# Patient Record
Sex: Female | Born: 1935 | Race: Black or African American | Hispanic: No | Smoking: Never smoker
Health system: Southern US, Community
[De-identification: ages and names within clinical notes are randomized; demographics above are authoritative.]

## PROBLEM LIST (undated history)

## (undated) DIAGNOSIS — C801 Malignant (primary) neoplasm, unspecified: Secondary | ICD-10-CM

## (undated) DIAGNOSIS — E119 Type 2 diabetes mellitus without complications: Secondary | ICD-10-CM

## (undated) DIAGNOSIS — I10 Essential (primary) hypertension: Secondary | ICD-10-CM

## (undated) DIAGNOSIS — C50919 Malignant neoplasm of unspecified site of unspecified female breast: Secondary | ICD-10-CM

## (undated) DIAGNOSIS — I4891 Unspecified atrial fibrillation: Secondary | ICD-10-CM

---

## 2010-02-14 ENCOUNTER — Encounter: Payer: Self-pay | Admitting: Pediatrics

## 2010-03-03 ENCOUNTER — Encounter: Payer: Self-pay | Admitting: Pediatrics

## 2011-03-22 ENCOUNTER — Encounter: Payer: Self-pay | Admitting: Orthopedic Surgery

## 2011-04-04 ENCOUNTER — Encounter: Payer: Self-pay | Admitting: Orthopedic Surgery

## 2011-06-06 ENCOUNTER — Emergency Department: Payer: Self-pay | Admitting: Emergency Medicine

## 2011-06-06 LAB — URINALYSIS, COMPLETE
Glucose,UR: NEGATIVE mg/dL (ref 0–75)
Hyaline Cast: 1
Leukocyte Esterase: NEGATIVE
Nitrite: NEGATIVE

## 2011-06-06 LAB — COMPREHENSIVE METABOLIC PANEL
Alkaline Phosphatase: 91 U/L (ref 50–136)
BUN: 17 mg/dL (ref 7–18)
Bilirubin,Total: 0.7 mg/dL (ref 0.2–1.0)
Calcium, Total: 9.6 mg/dL (ref 8.5–10.1)
Creatinine: 0.86 mg/dL (ref 0.60–1.30)
SGOT(AST): 29 U/L (ref 15–37)
SGPT (ALT): 17 U/L
Total Protein: 9.9 g/dL — ABNORMAL HIGH (ref 6.4–8.2)

## 2011-06-06 LAB — PROTIME-INR
INR: 2
Prothrombin Time: 22.9 secs — ABNORMAL HIGH (ref 11.5–14.7)

## 2011-06-06 LAB — TROPONIN I: Troponin-I: <0.02 ng/mL

## 2011-06-06 LAB — CBC
HGB: 12.6 g/dL (ref 12.0–16.0)
MCV: 88 fL (ref 80–100)
Platelet: 382 10*3/uL (ref 150–440)
RBC: 4.32 10*6/uL (ref 3.80–5.20)
WBC: 12.8 10*3/uL — ABNORMAL HIGH (ref 3.6–11.0)

## 2011-06-06 LAB — CK TOTAL AND CKMB (NOT AT ARMC)
CK, Total: 47 U/L (ref 21–215)
CK-MB: <0.5 ng/mL — ABNORMAL LOW (ref 0.5–3.6)

## 2011-06-08 LAB — URINE CULTURE

## 2011-07-04 ENCOUNTER — Inpatient Hospital Stay: Payer: Self-pay | Admitting: Internal Medicine

## 2011-07-04 LAB — TROPONIN I
Troponin-I: <0.02 ng/mL
Troponin-I: <0.02 ng/mL

## 2011-07-04 LAB — COMPREHENSIVE METABOLIC PANEL
Albumin: 3.5 g/dL (ref 3.4–5.0)
Anion Gap: 9 (ref 7–16)
BUN: 17 mg/dL (ref 7–18)
Calcium, Total: 8.8 mg/dL (ref 8.5–10.1)
EGFR (African American): >60
EGFR (Non-African Amer.): >60
SGOT(AST): 23 U/L (ref 15–37)
Sodium: 139 mmol/L (ref 136–145)
Total Protein: 9.1 g/dL — ABNORMAL HIGH (ref 6.4–8.2)

## 2011-07-04 LAB — PROTIME-INR
INR: 2.5
Prothrombin Time: 27.1 secs — ABNORMAL HIGH (ref 11.5–14.7)

## 2011-07-04 LAB — CK TOTAL AND CKMB (NOT AT ARMC)
CK, Total: 42 U/L (ref 21–215)
CK, Total: 41 U/L (ref 21–215)
CK-MB: <0.5 ng/mL — ABNORMAL LOW (ref 0.5–3.6)

## 2011-07-04 LAB — CBC
HCT: 33.4 % — ABNORMAL LOW (ref 35.0–47.0)
HGB: 11.1 g/dL — ABNORMAL LOW (ref 12.0–16.0)
MCH: 28.9 pg (ref 26.0–34.0)
MCHC: 33.1 g/dL (ref 32.0–36.0)
Platelet: 328 10*3/uL (ref 150–440)
WBC: 18.8 10*3/uL — ABNORMAL HIGH (ref 3.6–11.0)

## 2011-07-04 LAB — PRO B NATRIURETIC PEPTIDE: B-Type Natriuretic Peptide: 1172 pg/mL — ABNORMAL HIGH (ref 0–450)

## 2011-07-05 LAB — BASIC METABOLIC PANEL
Anion Gap: 6 — ABNORMAL LOW (ref 7–16)
BUN: 15 mg/dL (ref 7–18)
Calcium, Total: 8.9 mg/dL (ref 8.5–10.1)
Chloride: 100 mmol/L (ref 98–107)
EGFR (African American): >60
Glucose: 98 mg/dL (ref 65–99)
Osmolality: 275 (ref 275–301)
Sodium: 137 mmol/L (ref 136–145)

## 2011-07-05 LAB — CBC WITH DIFFERENTIAL/PLATELET
Basophil %: 0.2 %
Eosinophil #: 0.2 10*3/uL (ref 0.0–0.7)
HCT: 32.2 % — ABNORMAL LOW (ref 35.0–47.0)
HGB: 10.6 g/dL — ABNORMAL LOW (ref 12.0–16.0)
Lymphocyte #: 2.3 10*3/uL (ref 1.0–3.6)
Lymphocyte %: 23.3 %
MCH: 28.9 pg (ref 26.0–34.0)
MCHC: 33 g/dL (ref 32.0–36.0)
Monocyte #: 0.9 10*3/uL — ABNORMAL HIGH (ref 0.0–0.7)
Monocyte %: 8.6 %
Neutrophil #: 6.6 10*3/uL — ABNORMAL HIGH (ref 1.4–6.5)
Neutrophil %: 65.9 %
RBC: 3.68 10*6/uL — ABNORMAL LOW (ref 3.80–5.20)
RDW: 17.7 % — ABNORMAL HIGH (ref 11.5–14.5)
WBC: 10 10*3/uL (ref 3.6–11.0)

## 2011-07-05 LAB — TROPONIN I: Troponin-I: <0.02 ng/mL

## 2011-07-05 LAB — CK TOTAL AND CKMB (NOT AT ARMC): CK, Total: 44 U/L (ref 21–215)

## 2011-07-06 DIAGNOSIS — I059 Rheumatic mitral valve disease, unspecified: Secondary | ICD-10-CM

## 2011-07-06 LAB — PROTIME-INR: INR: 2.8

## 2011-07-06 LAB — BASIC METABOLIC PANEL
BUN: 17 mg/dL (ref 7–18)
Calcium, Total: 8.9 mg/dL (ref 8.5–10.1)
Co2: 34 mmol/L — ABNORMAL HIGH (ref 21–32)
EGFR (African American): >60
EGFR (Non-African Amer.): >60
Glucose: 95 mg/dL (ref 65–99)
Potassium: 3.4 mmol/L — ABNORMAL LOW (ref 3.5–5.1)
Sodium: 137 mmol/L (ref 136–145)

## 2011-07-07 LAB — PROTIME-INR: INR: 2.6

## 2011-07-10 LAB — CULTURE, BLOOD (SINGLE)

## 2012-06-19 ENCOUNTER — Encounter: Payer: Self-pay | Admitting: Orthopedic Surgery

## 2012-07-02 ENCOUNTER — Encounter: Payer: Self-pay | Admitting: Orthopedic Surgery

## 2012-08-01 ENCOUNTER — Encounter: Payer: Self-pay | Admitting: Orthopedic Surgery

## 2013-06-16 ENCOUNTER — Encounter: Payer: Self-pay | Admitting: Orthopedic Surgery

## 2013-07-02 ENCOUNTER — Encounter: Payer: Self-pay | Admitting: Orthopedic Surgery

## 2014-07-26 NOTE — H&P (Signed)
PATIENT NAME:  Cindy Tran, Cindy Tran MR#:  [id number] DATE OF BIRTH:  1931-02-16  DATE OF ADMISSION:  07/04/2011  ADMITTING PHYSICIAN: Gladstone Lighter, MD  PRIMARY CARE PHYSICIAN: Dr. Mervyn Skeeters [facility]   CHIEF COMPLAINT: Difficulty breathing.   HISTORY OF PRESENT ILLNESS: Cindy Tran is a 81 year old African American female with past medical history significant for hypertension, diabetes, history of breast cancer status post left mastectomy and radiation chemotherapy with recurrence of the cancer five years ago on the right side status post nipple resection with some radiation at the time, and history of atrial fibrillation and on Coumadin who comes to the hospital from home secondary to sudden onset of shortness of breath and wheezing that started early this morning. The patient lives at home with her son and says she has been having some gradual worsening dyspnea on exertion lately and has episodes of sudden onset of worsening dyspnea. It was so bad last night that she had to wake up around 3:00 a.m. and could not breathe. She denied any chest pain, nausea, cough, fevers, or exposure to anybody sick; no recent travel. She was brought into the emergency room and found to have a blood pressure of 212/99. She is currently on a nitro drip and received one dose of Lasix for pulmonary edema.   PAST MEDICAL HISTORY:  1. Hypertension.  2. Diabetes mellitus.  3. Atrial fibrillation, on Coumadin.  4. History of breast cancer in the 1980s status post left mastectomy with chemoradiation, recurrence of breast cancer five years ago to the right side status post nipple resection and also radiation therapy to the right side.  5. Hypothyroidism.  6. Osteoporosis.   PAST SURGICAL HISTORY:  1. Cataract surgery.  2. Left total knee replacement surgery.  3. Mastectomy.   ALLERGIES TO MEDICATIONS: Vicodin, Percocet, and oxycodone.  HOME MEDICATIONS:  1. Warfarin 5 mg p.o. daily, other than Thursday. On  Thursday she takes 2.5 mg. 2. Tylenol twice a day as needed.  3. Metoprolol ER 200 mg p.o. daily.  4. Levothyroxine 75 mcg p.o. daily.  5. Enalapril 20 mg p.o. twice a day. 6. Simvastatin 20 mg p.o. at bedtime.  7. Albuterol inhaler four times daily as needed.  8. Nexium 40 mg p.o. daily.  9. Diltiazem 30 mg p.o. every 6 hours.   SOCIAL HISTORY: She lives at home with her husband. No history of any smoking or alcohol use. She was exposed to passive smoking.   FAMILY HISTORY: Mom with congestive heart failure and dad died from lung cancer.   REVIEW OF SYSTEMS: CONSTITUTIONAL: No fever, fatigue, or weakness.  EYES: No blurred vision, double vision, or glaucoma. Positive for history of cataracts. ENT: Positive for sinus headaches across the forehead and also in the facial area. No loss of hearing. No tinnitus. No ear discharge. No difficulty swallowing. RESPIRATORY: Positive for dyspnea and wheezing. No history of chronic obstructive pulmonary disease. No cough. CARDIOVASCULAR: No chest pain, orthopnea, or edema. Positive for history of atrial fibrillation. GI: No nausea, vomiting, diarrhea, abdominal pain, hematemesis, or melena. GENITOURINARY: No dysuria. Positive for incontinence and also increased frequency at nighttime. ENDOCRINE: No polyuria, nocturia, heat or cold intolerance. Positive for hypothyroidism. HEMATOLOGY: No anemia, easy bruising, or bleeding. SKIN: No acne, rash, or lesions. MUSCULOSKELETAL: Positive for arthritis, especially in the knees. No gout. NEUROLOGIC: No CVA, transient ischemic attack, or seizures. PSYCHOLOGIC: No anxiety, insomnia, or depression.   PHYSICAL EXAMINATION:   VITAL SIGNS: Temperature 96.4 degrees Fahrenheit, pulse 74, respirations  35, blood pressure 212/99, and pulse oximetry 94% on room air currently.  GENERAL: Heavily built, well-nourished female sitting in bed not in any acute distress.   HEENT: Normocephalic, atraumatic. Pupils equal, round, and  reacting to light. Anicteric sclerae. Extraocular movements intact. Oropharynx clear without erythema, mass, or exudates.   NECK: Supple. No thyromegaly, JVD, or carotid bruits. No lymphadenopathy.   LUNGS: Moving air bilaterally. Has diffuse scattered wheeze and bibasilar crackles. No use of accessory muscles for breathing.   HEART: S1 and S2 regular rate and rhythm. No murmurs, rubs, or gallops.   ABDOMEN: Soft, nontender, and nondistended. No hepatosplenomegaly. Normal bowel sounds.   EXTREMITIES: She does have 2+ pedal edema on the right foot and 1+ on the left side. Feeble dorsalis pedis pulses bilaterally. No clubbing or cyanosis.   SKIN: No acne, rash, or lesions.   NEUROLOGIC: Cranial nerves intact. No focal motor or sensory deficits.   PSYCH: The patient is awake, alert, and oriented x3.   LABS/STUDIES:  WBC 18.8, hemoglobin 11.1, hematocrit 33.4, and platelet count 328.   Sodium 139, potassium 3.8, chloride 102, bicarbonate 28, BUN 17, creatinine 0.83, glucose 156, and calcium 8.8.   ALT 16, AST 23, alkaline phosphatase 95, total bilirubin 0.6, and albumin 3.5. BNP is elevated at 1172. Cardiac enzymes, first set, remains negative. INR is therapeutic at 2.5.   Chest x-ray is showing previous or recurrent right upper lobe malignant disease associated with fibrosis and superimposed right pleural effusion and some mild edema suggested.  CT of the head without contrast is showing chronic changes within the brain. No evidence of acute ischemic or hemorrhagic event. Inflammatory changes with air-fluid levels seen in the ethmoid and right maxillary and sphenoid sinuses which could be causing the patient's acute symptoms.  CT of the chest with contrast is showing interval increase in size of the bilateral pleural effusions, increased density in the left upper lobe and the left costophrenic angle posteriorly reflecting atelectasis or early pneumonia. Overall interstitial markings  suggestive of CHF.  Extensive chronic changes in the right upper lobe and right perihilar region is present. Cannot exclude infectious or neoplastic etiologies superimposed upon chronic lung disease. Multiple gallstones are present. No evidence of PE.   EKG is showing normal sinus rhythm, no acute ST-T wave changes.   ASSESSMENT AND PLAN: This is a 81 year old African American female with past medical history of hypertension, breast cancer status post treatment, and atrial fibrillation on Coumadin brought in for acute dyspnea, hypertensive urgency, and pulmonary edema.  1. Acute respiratory failure secondary to pulmonary edema and also possible pneumonia versus radiation induced fibrosis in the right upper lobe. CT of the chest has been just done and it shows possible left lower lobe pneumonia and also chronic changes in the right upper lobe. Send for blood cultures and start on Rocephin and azithromycin. Continue oxygen support as needed. She was given one dose of Lasix 60 mg IV in the ED and we will start her on 20 mg IV twice a day and also get an echocardiogram.  2. Acute pulmonary edema associated with hypertensive urgency. No prior history of congestive heart failure. We will diurese her with Lasix 20 mg IV twice a day and get an echo for ejection fraction evaluation. 3. Hypertensive urgency. Currently started on nitro drip, so we will admit to the Critical Care Unit. Try to wean off the nitro drip and can be transferred to a telemetry bed. We will restart her home medications of  Toprol, Cardizem, and enalapril at this time, and other medications like Imdur can be added. We will hold off on Norvasc because of worsening pedal edema.  4. Atrial fibrillation, rate controlled on Toprol and Cardizem, also on Coumadin. INR is therapeutic.  5. GI and deep vein thrombosis prophylaxis. The patient is on Nexium and also on Coumadin.   CODE STATUS: FULL CODE.   TIME SPENT ON ADMISSION: 50 minutes.   ____________________________ Gladstone Lighter, MD rk:slb D: 07/04/2011 11:38:00 ET     T: 07/04/2011 12:20:04 ET        JOB#: [id number] cc: Dr. Mervyn Skeeters [facility]  Gladstone Lighter MD ELECTRONICALLY SIGNED 07/07/2011 13:48

## 2014-07-26 NOTE — Discharge Summary (Signed)
PATIENT NAME:  Cindy Tran, Cindy Tran MR#:  [id number] DATE OF BIRTH:  1930/10/04  DATE OF ADMISSION:  07/04/2011 DATE OF DISCHARGE:  07/07/2011  ADMITTING PHYSICIAN: Gladstone Lighter, MD  DISCHARGING PHYSICIAN: Gladstone Lighter, MD  PRIMARY CARE PHYSICIAN: Lacey Jensen, MD - [facility]   CONSULTANTS: Wallene Huh, MD - Pulmonary.   DISCHARGE DIAGNOSES:  1. Acute respiratory failure.  2. Pulmonary edema.  3. Acute on chronic congestive heart failure with both systolic and diastolic dysfunction, ejection fraction is 45%.  4. Left-sided pneumonia.  5. Hypertensive urgency.  6. Diabetes mellitus.  7. Chronic atrial fibrillation, on Coumadin.  8. Hypothyroidism.  9. History of breast cancer status post left mastectomy and chemoradiation.  10. Acute sinusitis with sinus headaches.  DISCHARGE MEDICATIONS:  1. Albuterol 90 mcg inhalation four times daily.  2. Nexium 40 mg p.o. daily.  3. Metoprolol succinate 200 mg p.o. daily.  4. Levothyroxine 75 mcg p.o. daily.  5. Enalapril 20 mg p.o. twice a day. 6. Tylenol Arthritis capsules 1300 mg orally twice a day as needed.  7. Simvastatin 20 mg p.o. daily.  8. Cardizem 30 mg p.o. every 6 hours.  9. Warfarin 5 mg p.o. every day except Thursday, on Thursday to take 2.5 mg.   10. Lasix 40 mg p.o. twice a day. 11. Levaquin 500 mg p.o. daily until 07/10/2011. 12. Nasonex nasal spray 100 mcg two sprays to each nostril twice a day. 13. Potassium chloride 20 milliequivalents p.o. daily.  DISCHARGE DIET: Low-sodium diet.   DISCHARGE ACTIVITY: As tolerated.  FOLLOWUP INSTRUCTIONS: PCP followup in one week and INR check in 2 to 3 days.   LABS AND IMAGING STUDIES: WBC 10.0, hemoglobin 10.6, hematocrit 32.2, platelet count 306.   Sodium 137, potassium 3.4, chloride 96, bicarbonate 34, BUN 17, creatinine 0.91, glucose 95, calcium 8.9.   INR at the time of discharge 2.6.  MRI of the brain showed significant sinus disease. Could  be the etiology of the patient's headaches. Correlate symptoms for acute sinusitis. Changes of atrophy, small vessel chronic ischemic disease present in the brain. No acute intracranial abnormalities seen. Her sinus changes are more significant on the right side than the left side, in the maxillary sinus and sphenoid sinus. There are small air fluid levels in right sphenoid. Also present bilaterally in frontal sinuses with small amount of fluid in the left frontal sinus.   Echocardiogram showed LV systolic function is mildly reduced. Ejection fraction is 45% to 50%. Moderate concentric left ventricular hypertrophy and impaired LV relaxation are present. Right ventricular systolic function is normal. Right and left atrium are mildly dilated and mild mitral regurgitation is present.   Blood cultures remained negative.   CT of the head without contrast showed inflammation changes with air fluid levels in the right maxillary, sphenoid sinus and left frontal sinus. No evidence of any acute changes.   CT of the chest with contrast showed interval decrease in size of bilateral pleural effusions. Increased interstitial density in the left upper lobe and left costophrenic angle, likely early pneumonia. Extensive chronic changes in the right upper lobe, right perihilar region. Multiple gallstones are present. No evidence of any acute PE.  Chest x-ray on admission showed right upper lobe malignant disease with fibrosis versus chronic changes. Superimposed right pleural effusion and possible mild edema present.   BNP was elevated at 1172 on admission. Cardiac enzymes remained negative.   BRIEF HOSPITAL COURSE: Cindy Tran is a 81 year old female with past medical history significant  for diabetes, hypertension, atrial fibrillation on Coumadin, and diastolic congestive heart failure who presented to the hospital with sudden onset of shortness of breath. The patient was also found to be in hypertensive urgency with  blood pressure of 212/99 and chest x-ray with pulmonary edema.  1. Acute hypoxic respiratory failure requiring 4 liters of oxygen in the hospital secondary to pulmonary edema and also left upper lobe pneumonia. Blood cultures have remained negative. She was treated with Rocephin and Zithromax in the hospital and is being discharged on Levaquin. She also does have right upper lobe and perihilar region chronic changes/fibrosis on CT which is secondary to radiation from breast cancer.  2. Acute on chronic diastolic and systolic heart failure with an ejection fraction of 45%: Discussed this with the patient's primary care physician, Dr. Mervyn Skeeters, and the patient has known history of diastolic dysfunction and now with mild systolic dysfunction. Probably hypertensive urgency could have triggered it. She was diuresed with Lasix 40 mg IV twice achieving clinical improvement and also weaned off oxygen. She is ambulating well on room air currently and is being discharged on twice a day Lasix along with potassium supplements also. She is on medications with Toprol, enalapril, and simvastatin.  3. Chronic atrial fibrillation: Rate is better controlled with p.o. Cardizem and Toprol. She is also on warfarin for anticoagulation. INR is therapeutic.  4. Chronic headaches, possible sinus headaches, as seen on MRI of the brain, as mentioned above. Nasonex nasal spray is being started and also she will be on an antibiotic and will be followed by her PCP as an outpatient.  5. Hypertensive urgency: Blood pressure was better controlled. She is on enalapril, Toprol, Cardizem, and also Lasix is being added at the time of discharge.   Her overall course has been otherwise uneventful. Physical therapy worked with the patient and recommended home health, but the patient refused home health and the daughter supported the patient and said she would take care of her mother.   DISCHARGE CONDITION: Stable.     DISCHARGE DISPOSITION:  Home.   TIME SPENT ON DISCHARGE: 45 minutes. ____________________________ Gladstone Lighter, MD rk:slb D: 07/07/2011 13:32:32 ET T: 07/08/2011 13:44:20 ET JOB#: [id number]  cc: Gladstone Lighter, MD, <Dictator> Lacey Jensen, MD - [place] MD ELECTRONICALLY SIGNED 07/12/2011 13:43

## 2016-03-07 ENCOUNTER — Ambulatory Visit: Payer: Medicare Other | Attending: Gastroenterology | Admitting: Physical Therapy

## 2016-03-07 DIAGNOSIS — R2689 Other abnormalities of gait and mobility: Secondary | ICD-10-CM | POA: Diagnosis present

## 2016-03-07 DIAGNOSIS — M545 Low back pain, unspecified: Secondary | ICD-10-CM

## 2016-03-07 DIAGNOSIS — G8929 Other chronic pain: Secondary | ICD-10-CM | POA: Diagnosis present

## 2016-03-07 DIAGNOSIS — M6281 Muscle weakness (generalized): Secondary | ICD-10-CM | POA: Diagnosis present

## 2016-03-07 DIAGNOSIS — R269 Unspecified abnormalities of gait and mobility: Secondary | ICD-10-CM | POA: Diagnosis present

## 2016-03-08 ENCOUNTER — Encounter: Payer: Self-pay | Admitting: Physical Therapy

## 2016-03-08 NOTE — Therapy (Signed)
[facility] [address] [place], [name], [address] Phone: [phone]   Fax:  [phone]  Physical Therapy Evaluation  Patient Details  Name: Cindy Tran MRN: [id number] Date of Birth: 04/23/1930 Referring Provider: Rolene Arbour, FNP  Encounter Date: 03/07/2016      PT End of Session - 03/08/16 1746    Visit Number 1   Number of Visits 8   Date for PT Re-Evaluation 04/04/16   Authorization - Visit Number 1   Authorization - Number of Visits 10   PT Start Time [id number]   PT Stop Time 0952   PT Time Calculation (min) 57 min   Equipment Utilized During Treatment Gait belt   Activity Tolerance Patient tolerated treatment well;Patient limited by fatigue   Behavior During Therapy [facility] for tasks assessed/performed      History reviewed. No pertinent past medical history.  History reviewed. No pertinent surgical history.  There were no vitals filed for this visit.       Subjective Assessment - 03/08/16 1733    Subjective Pt. reports significant R LE muscle weakness resulting in 2 recent falls.  Pt. fell on 02/23/16 while climbing stairs in front of daughter's house.  Pt. reports R knee buckles and requires use of RW.  Pt. has increase LBP with standing 5/10.      Patient is accompained by: Family member   Limitations Standing;Walking;House hold activities   Patient Stated Goals Increase LE muscle strength to improve balance and gait.     Currently in Pain? Yes   Pain Score 5    Pain Location Back   Pain Orientation Mid;Lower            [facility] PT Assessment - 03/08/16 0001      Assessment   Medical Diagnosis R leg weakness/ Atrophy of muscle of R lower leg/ Chronic bilateral low back pain without sciatica   Referring Provider Rolene Arbour, FNP   Onset Date/Surgical Date 02/23/16   Prior Therapy yes     Balance Screen   Has the patient fallen in the past 6 months Yes   How many times? 2   Has the patient had a  decrease in activity level because of a fear of falling?  Yes      See HEP.  Nustep L4 B UE/LE 10 min. (no rest breaks).        PT Education - 03/07/16 1322    Education provided Yes   Education Details See HEP   Person(s) Educated Patient;Child(ren);Caregiver(s)   Methods Explanation;Demonstration;Handout;Verbal cues   Comprehension Verbalized understanding;Returned demonstration;Need further instruction             PT Long Term Goals - 03/08/16 1757      PT LONG TERM GOAL #1   Title Pt. independent with HEP to increase B LE muscle strength to grossly 4/5 MMT to improve standing tolerance/ walking.    Baseline B LE muscle strength grossly 3/5 MMT   Time 4   Period Weeks   Status New     PT LONG TERM GOAL #2   Title Pt. able to ascend/ descend 4 steps use of 1 UE and step to pattern safely.     Baseline Difficulty stepping up with L or R LE on 6" step.     Time 4   Period Weeks   Status New     PT LONG TERM GOAL #3   Title Pt. able to ambulate 10 minutes with  use of RW and consistent step pattern with no LOB to improve mobility.     Baseline limited standing/walking endurance   Time 4   Period Weeks   Status New     PT LONG TERM GOAL #4   Title Pt. will have no episodes of R knee buckling consistently for a week to promote safety/ prevent fall risk.     Baseline Frequent episodes of R knee buckling with standing/walking   Time 4   Period Weeks   Status New               Plan - 03/08/16 1747    Clinical Impression Statement Pt. is a pleasant 82 y/o female with chronic, progressive B LE muscle weakness (R weaker than L) and back pain.  Pt. reports 5/10 LBP with prolonged standing/walking.  Significant grade III pitting edema in B lower legs.  Pt. has difficulty wearing shoes and currently has on bedroom slippers.  B LE AROM WFL except hip flexion limited 50%.  B LE muscle strength grossly 3/5 MMT.  Pt. requires numerous seated rest breaks with all  standing/walking tasks.  Pt. ambulates with forward posture and use of RW for safety.  Pt. will benefit from skilled PT services to increase B LE muscle strength to improve standing tolerance/ walking/ balance to prevent falls.     Rehab Potential Fair   PT Frequency 2x / week   PT Duration 4 weeks   PT Treatment/Interventions ADLs/Self Care Home Management;DME Instruction;Gait training;Stair training;Functional mobility training;Therapeutic activities;Therapeutic exercise;Patient/family education;Neuromuscular re-education;Balance training;Manual techniques;Energy conservation;Passive range of motion   PT Next Visit Plan Reassess HEP/ balance and gait activities.     PT Home Exercise Plan See HEP      Patient will benefit from skilled therapeutic intervention in order to improve the following deficits and impairments:  Abnormal gait, Improper body mechanics, Pain, Postural dysfunction, Decreased coordination, Decreased activity tolerance, Decreased endurance, Decreased range of motion, Decreased strength, Difficulty walking, Decreased safety awareness, Decreased balance  Visit Diagnosis: Gait difficulty  Muscle weakness (generalized)  Chronic bilateral low back pain without sciatica  Abnormality of gait due to impairment of balance      G-Codes - 03-13-2016 1756    Functional Assessment Tool Used Clinical impression/ gait difficulty/ pain/ muscle weakness   Functional Limitation Mobility: Walking and moving around   Mobility: Walking and Moving Around Current Status [id number]) At least 60 percent but less than 80 percent impaired, limited or restricted   Mobility: Walking and Moving Around Goal Status [phone]) At least 20 percent but less than 40 percent impaired, limited or restricted       Problem List There are no active problems to display for this patient.  Pura Spice, PT, DPT # [phone] 03/08/2016, 6:02 PM  [facility] [address] [place], [name], [address] Phone: [phone]   Fax:  [phone]  Name: Cindy Tran MRN: [id number] Date of Birth: 1931/01/23

## 2016-03-09 ENCOUNTER — Ambulatory Visit: Payer: Medicare Other | Admitting: Physical Therapy

## 2016-03-09 DIAGNOSIS — R269 Unspecified abnormalities of gait and mobility: Secondary | ICD-10-CM

## 2016-03-09 DIAGNOSIS — M6281 Muscle weakness (generalized): Secondary | ICD-10-CM

## 2016-03-09 DIAGNOSIS — M545 Low back pain, unspecified: Secondary | ICD-10-CM

## 2016-03-09 DIAGNOSIS — G8929 Other chronic pain: Secondary | ICD-10-CM

## 2016-03-09 DIAGNOSIS — R2689 Other abnormalities of gait and mobility: Secondary | ICD-10-CM

## 2016-03-10 NOTE — Therapy (Addendum)
[facility] [address] [place], [name], [address] Phone: [phone]   Fax:  [phone]  Physical Therapy Treatment  Patient Details  Name: Cindy Tran MRN: [id number] Date of Birth: 07/30/1930 Referring Provider: Rolene Arbour, FNP  Encounter Date: 03/09/2016      PT End of Session - 03/10/16 1619    Visit Number 2   Number of Visits 8   Date for PT Re-Evaluation 04/04/16   Authorization - Visit Number 2   Authorization - Number of Visits 10   PT Start Time 1350   PT Stop Time 1448   PT Time Calculation (min) 58 min   Equipment Utilized During Treatment Gait belt   Activity Tolerance Patient tolerated treatment well;Patient limited by fatigue   Behavior During Therapy [facility] for tasks assessed/performed      No past medical history on file.  No past surgical history on file.  There were no vitals filed for this visit.      Subjective Assessment - 03/10/16 1618    Patient is accompained by: Family member   Limitations Standing;Walking;House hold activities   Patient Stated Goals Increase LE muscle strength to improve balance and gait.     Currently in Pain? No/denies     LEFS: 22 out of 80.   OBJECTIVE:  Therex:  Supine ex.: heel slides/ quad sets/ hip flexion/ marching/ trunk rotn.  Seated LAQ/ heel raises/ attempted hip flexion but requires PT assist.  Reviewed HEP.  Neuro: 3" step ups/ downs 10x.  Transfer training for varying heights on blue mat table.  Step overs 3" plinth.  Gait training: instructed in proper use of rollator with consistent step pattern/ cuing for increase hip/knee flexion.  Pt. Requires mod verbal cuing to correct upright posture/ head position.  Nustep L4 10 min. B UE/LE at end of tx. (no charge).      Pt response for medical necessity:  Pt. Benefits from PT to increase generalized strength/ muscle endurance to improve standing tolerance and mod. Independence with gait/rollator.        Pt. benefits from use of rollator with gait in house and outside.  Pt. good at controlling brakes during transfers/ prolonged standing and while sitting on rollator.  Pt. easily fatigued during all aspects of ther.ex.           PT Long Term Goals - 03/08/16 1757      PT LONG TERM GOAL #1   Title Pt. independent with HEP to increase B LE muscle strength to grossly 4/5 MMT to improve standing tolerance/ walking.    Baseline B LE muscle strength grossly 3/5 MMT   Time 4   Period Weeks   Status New     PT LONG TERM GOAL #2   Title Pt. able to ascend/ descend 4 steps use of 1 UE and step to pattern safely.     Baseline Difficulty stepping up with L or R LE on 6" step.     Time 4   Period Weeks   Status New     PT LONG TERM GOAL #3   Title Pt. able to ambulate 10 minutes with use of RW and consistent step pattern with no LOB to improve mobility.     Baseline limited standing/walking endurance   Time 4   Period Weeks   Status New     PT LONG TERM GOAL #4   Title Pt. will have no episodes of R knee buckling consistently for  a week to promote safety/ prevent fall risk.     Baseline Frequent episodes of R knee buckling with standing/walking   Time 4   Period Weeks   Status New               Plan - 03/10/16 1620    Rehab Potential Fair   PT Frequency 2x / week   PT Duration 4 weeks   PT Treatment/Interventions ADLs/Self Care Home Management;DME Instruction;Gait training;Stair training;Functional mobility training;Therapeutic activities;Therapeutic exercise;Patient/family education;Neuromuscular re-education;Balance training;Manual techniques;Energy conservation;Passive range of motion   PT Next Visit Plan Reassess HEP/ balance and gait activities.     PT Home Exercise Plan See HEP   Consulted and Agree with Plan of Care Patient;Family member/caregiver      Patient will benefit from skilled therapeutic intervention in order to improve the following deficits and  impairments:  Abnormal gait, Improper body mechanics, Pain, Postural dysfunction, Decreased coordination, Decreased activity tolerance, Decreased endurance, Decreased range of motion, Decreased strength, Difficulty walking, Decreased safety awareness, Decreased balance  Visit Diagnosis: Gait difficulty  Muscle weakness (generalized)  Chronic bilateral low back pain without sciatica  Abnormality of gait due to impairment of balance     Problem List There are no active problems to display for this patient.  Pura Spice, PT, DPT # [id number] 03/10/2016, 4:21 PM  [place] [facility] [address] [place], [name], [address] Phone: [phone]   Fax:  [phone]  Name: Rosia Rhome MRN: [id number] Date of Birth: 1930/06/15

## 2016-03-14 ENCOUNTER — Ambulatory Visit: Payer: Medicare Other | Admitting: Physical Therapy

## 2016-03-14 DIAGNOSIS — M6281 Muscle weakness (generalized): Secondary | ICD-10-CM

## 2016-03-14 DIAGNOSIS — R269 Unspecified abnormalities of gait and mobility: Secondary | ICD-10-CM

## 2016-03-14 DIAGNOSIS — R2689 Other abnormalities of gait and mobility: Secondary | ICD-10-CM

## 2016-03-14 DIAGNOSIS — G8929 Other chronic pain: Secondary | ICD-10-CM

## 2016-03-14 DIAGNOSIS — M545 Low back pain: Secondary | ICD-10-CM

## 2016-03-15 NOTE — Therapy (Signed)
[place] [facility] [address] [place], [name], [address] Phone: [phone]   Fax:  [phone]  Physical Therapy Treatment  Patient Details  Name: Cindy Tran MRN: [id number] Date of Birth: 06/23/1930 Referring Provider: Rolene Arbour, FNP  Encounter Date: 03/14/2016      PT End of Session - 03/15/16 1433    Visit Number 3   Number of Visits 8   Date for PT Re-Evaluation 04/04/16   Authorization - Visit Number 3   Authorization - Number of Visits 10   PT Start Time 0947   PT Stop Time 1045   PT Time Calculation (min) 58 min   Equipment Utilized During Treatment Gait belt   Activity Tolerance Patient tolerated treatment well;Patient limited by fatigue   Behavior During Therapy [facility] for tasks assessed/performed      No past medical history on file.  No past surgical history on file.  There were no vitals filed for this visit.      Subjective Assessment - 03/15/16 1432    Subjective Pt. entered PT with new rollator and wants PT to explain how to use/ make sure it is safe.  Pts. dtr. states that pt. hasn't been able to go out to restaurant due to limitations in walking and is hoping with rollator/ strengthening she will be more independent outside of house.     Patient is accompained by: Family member   Limitations Standing;Walking;House hold activities   Patient Stated Goals Increase LE muscle strength to improve balance and gait.     Currently in Pain? No/denies        OBJECTIVE:  There.ex.: Seated hip flexion (with assist)/ LAQ/ heel raises/ step touches (6") 10x2. Each.  Lateral walking in //-bars with min. UE assist 5x L/R.  3" step ups/ overs with UE assist 10x2.  Gait training:  Ambulate in clinic with use of rollator.  Pt. Properly sized in rollator and brakes adjusted.  Pt. Requires moderate cuing to increase hip flexion/ step pattern to improve gait efficiency/ prevent shuffling pattern.  Several short seated rest  breaks required.      Pt response for medical necessity:  Benefits from B LE strengthening/ endurance ex. Program to improve standing tolerance and safety with walking.  No LOB during tx. Session but requires B UE assist for all walking tasks.          PT Long Term Goals - 03/08/16 1757      PT LONG TERM GOAL #1   Title Pt. independent with HEP to increase B LE muscle strength to grossly 4/5 MMT to improve standing tolerance/ walking.    Baseline B LE muscle strength grossly 3/5 MMT   Time 4   Period Weeks   Status New     PT LONG TERM GOAL #2   Title Pt. able to ascend/ descend 4 steps use of 1 UE and step to pattern safely.     Baseline Difficulty stepping up with L or R LE on 6" step.     Time 4   Period Weeks   Status New     PT LONG TERM GOAL #3   Title Pt. able to ambulate 10 minutes with use of RW and consistent step pattern with no LOB to improve mobility.     Baseline limited standing/walking endurance   Time 4   Period Weeks   Status New     PT LONG TERM GOAL #4   Title Pt. will have  no episodes of R knee buckling consistently for a week to promote safety/ prevent fall risk.     Baseline Frequent episodes of R knee buckling with standing/walking   Time 4   Period Weeks   Status New               Plan - 03/15/16 1433    Clinical Impression Statement No c/o pain during tx. session but L hip soreness noted with standing tasks.  Pt. instructed in proper use of rollator and ambulates with improved overall mobility around clinic with rollator as compared to RW.  Pt. is easily fatigued with standing ther.ex. and requires occasional seated rest breaks.  Pt. able to lock brakes of rollator and sit on chair with proper technique safely.  Difficulty with seated hip flexion due to muscle weakness.    Rehab Potential Fair   PT Frequency 2x / week   PT Treatment/Interventions ADLs/Self Care Home Management;DME Instruction;Gait training;Stair training;Functional  mobility training;Therapeutic activities;Therapeutic exercise;Patient/family education;Neuromuscular re-education;Balance training;Manual techniques;Energy conservation;Passive range of motion   PT Next Visit Plan Increase B LE muscle strength/ discuss use of rollator at home   PT Home Exercise Plan See HEP   Consulted and Agree with Plan of Care Patient;Family member/caregiver      Patient will benefit from skilled therapeutic intervention in order to improve the following deficits and impairments:  Abnormal gait, Improper body mechanics, Pain, Postural dysfunction, Decreased coordination, Decreased activity tolerance, Decreased endurance, Decreased range of motion, Decreased strength, Difficulty walking, Decreased safety awareness, Decreased balance  Visit Diagnosis: Gait difficulty  Muscle weakness (generalized)  Chronic bilateral low back pain without sciatica  Abnormality of gait due to impairment of balance     Problem List There are no active problems to display for this patient.  Pura Spice, PT, DPT # [phone] 03/15/2016, 2:40 PM  [place] [facility] [address] [place], [name], [address] Phone: [phone]   Fax:  [phone]  Name: Cindy Tran MRN: [id number] Date of Birth: July 10, 1930

## 2016-03-16 ENCOUNTER — Ambulatory Visit: Payer: Medicare Other | Admitting: Physical Therapy

## 2016-03-16 DIAGNOSIS — R269 Unspecified abnormalities of gait and mobility: Secondary | ICD-10-CM

## 2016-03-16 DIAGNOSIS — M545 Low back pain, unspecified: Secondary | ICD-10-CM

## 2016-03-16 DIAGNOSIS — R2689 Other abnormalities of gait and mobility: Secondary | ICD-10-CM

## 2016-03-16 DIAGNOSIS — G8929 Other chronic pain: Secondary | ICD-10-CM

## 2016-03-16 DIAGNOSIS — M6281 Muscle weakness (generalized): Secondary | ICD-10-CM

## 2016-03-16 NOTE — Therapy (Addendum)
[facility] [address] [place], [name], [address] Phone: [phone]   Fax:  [phone]  Physical Therapy Treatment  Patient Details  Name: Cindy Tran MRN: [id number] Date of Birth: 1930/08/02 Referring Provider: Rolene Arbour, FNP  Encounter Date: 03/16/2016      PT End of Session - 03/16/16 1216    Visit Number 4   Number of Visits 8   Date for PT Re-Evaluation 04/04/16   Authorization - Visit Number 4   Authorization - Number of Visits 10   PT Start Time 0944   PT Stop Time 1037   PT Time Calculation (min) 53 min   Equipment Utilized During Treatment Gait belt   Activity Tolerance Patient tolerated treatment well;Patient limited by fatigue   Behavior During Therapy [facility] for tasks assessed/performed      No past medical history on file.  No past surgical history on file.  There were no vitals filed for this visit.      Subjective Assessment - 03/16/16 1216    Patient is accompained by: Family member   Limitations Standing;Walking;House hold activities   Patient Stated Goals Increase LE muscle strength to improve balance and gait.     Currently in Pain? No/denies      Pt. states she has liked using rollator at home.  Pt. has not used RW since beginning use of rollator.    OBJECTIVE:  There.ex.: Nustep L6 10 min. (no charge/ warm-up).  Sit to stands from green chair with moderate cuing for proper technique.  Seated hip flexion (with assist)/ LAQ/ heel raises/ step touches (6") 10x2. Each.  Lateral walking in //-bars with min. UE assist 5x L/R.  3" step ups/ overs with UE assist 10x2.  Neuro. Mm.:  Ambulate in clinic with use of rollator.  Pt. Requires moderate cuing to increase hip flexion/ step pattern to improve gait efficiency/ prevent shuffling pattern.  Turning in //-bars/ rollator with UE assist working on foot placement/ posture.  Standing dynamic balance tasks with 1 UE assist.  Several short seated rest  breaks required.                 Pt response for medical necessity:  Benefits from B LE strengthening/ endurance ex. Program to improve standing tolerance and safety with walking.  No LOB during tx. Session but requires B UE assist for all walking tasks.      Limited B hip flexor muscle strength/ ROM in sitting and standing posture.  Pt. benefits from UE assist for safety/ balance.  No LOB during tx. session but pt. remains fearful of B knee buckling during turning/ step touches.  Moderate generalized muscle fatigue t/o tx. session.         PT Long Term Goals - 03/08/16 1757      PT LONG TERM GOAL #1   Title Pt. independent with HEP to increase B LE muscle strength to grossly 4/5 MMT to improve standing tolerance/ walking.    Baseline B LE muscle strength grossly 3/5 MMT   Time 4   Period Weeks   Status New     PT LONG TERM GOAL #2   Title Pt. able to ascend/ descend 4 steps use of 1 UE and step to pattern safely.     Baseline Difficulty stepping up with L or R LE on 6" step.     Time 4   Period Weeks   Status New     PT LONG TERM  GOAL #3   Title Pt. able to ambulate 10 minutes with use of RW and consistent step pattern with no LOB to improve mobility.     Baseline limited standing/walking endurance   Time 4   Period Weeks   Status New     PT LONG TERM GOAL #4   Title Pt. will have no episodes of R knee buckling consistently for a week to promote safety/ prevent fall risk.     Baseline Frequent episodes of R knee buckling with standing/walking   Time 4   Period Weeks   Status New               Plan - 03/16/16 1216    Rehab Potential Fair   PT Frequency 2x / week   PT Duration 4 weeks   PT Treatment/Interventions ADLs/Self Care Home Management;DME Instruction;Gait training;Stair training;Functional mobility training;Therapeutic activities;Therapeutic exercise;Patient/family education;Neuromuscular re-education;Balance training;Manual techniques;Energy  conservation;Passive range of motion   PT Next Visit Plan Increase B LE muscle strength/ discuss use of rollator at home   PT Home Exercise Plan See HEP   Consulted and Agree with Plan of Care Patient;Family member/caregiver      Patient will benefit from skilled therapeutic intervention in order to improve the following deficits and impairments:  Abnormal gait, Improper body mechanics, Pain, Postural dysfunction, Decreased coordination, Decreased activity tolerance, Decreased endurance, Decreased range of motion, Decreased strength, Difficulty walking, Decreased safety awareness, Decreased balance  Visit Diagnosis: Gait difficulty  Muscle weakness (generalized)  Chronic bilateral low back pain without sciatica  Abnormality of gait due to impairment of balance     Problem List There are no active problems to display for this patient.  Pura Spice, PT, DPT # [id number] 03/16/2016, 12:17 PM  [facility] [address] [place], [name], [address] Phone: [phone]   Fax:  [phone]  Name: Cindy Tran MRN: [id number] Date of Birth: 1930/04/18

## 2016-03-21 ENCOUNTER — Ambulatory Visit: Payer: Medicare Other | Admitting: Physical Therapy

## 2016-03-21 DIAGNOSIS — M545 Low back pain: Secondary | ICD-10-CM

## 2016-03-21 DIAGNOSIS — G8929 Other chronic pain: Secondary | ICD-10-CM

## 2016-03-21 DIAGNOSIS — R269 Unspecified abnormalities of gait and mobility: Secondary | ICD-10-CM | POA: Diagnosis not present

## 2016-03-21 DIAGNOSIS — M6281 Muscle weakness (generalized): Secondary | ICD-10-CM

## 2016-03-21 DIAGNOSIS — R2689 Other abnormalities of gait and mobility: Secondary | ICD-10-CM

## 2016-03-21 NOTE — Therapy (Signed)
[place] [facility] [address] [place], [name], [address] Phone: [phone]   Fax:  [phone]  Physical Therapy Treatment  Patient Details  Name: Cindy Tran MRN: [id number] Date of Birth: [birth year] Referring Provider: Rolene Arbour, FNP  Encounter Date: 03/21/2016      PT End of Session - 03/21/16 0949    Visit Number 5   Number of Visits 8   Date for PT Re-Evaluation 04/04/16   Authorization - Visit Number 5   Authorization - Number of Visits 10   PT Start Time 0944   PT Stop Time 1041   PT Time Calculation (min) 57 min   Equipment Utilized During Treatment Gait belt   Activity Tolerance Patient tolerated treatment well;Patient limited by fatigue   Behavior During Therapy [facility] for tasks assessed/performed      No past medical history on file.  No past surgical history on file.  There were no vitals filed for this visit.      Subjective Assessment - 03/21/16 0947    Subjective Pt. went walking outside with son yesterday.  Pt. still working to get out of house to walk into [employer].  Pt. reports a little back discomfort.     Patient is accompained by: Family member   Limitations Standing;Walking;House hold activities   Patient Stated Goals Increase LE muscle strength to improve balance and gait.     Currently in Pain? No/denies        OBJECTIVE:  There.ex.: Nustep L6 10 min. B UE/LE (warm-up/ no charge).  Seated/standing ex.(no wts.)- PT assist with hip flexion in sitting.  Neuro:  NBOS with standing balance (EC/EO)- CGA for cuing/ safety.  Lateral walking in //-bars with min. UE assist 5x L/R.  3" step ups/ overs with UE assist 10x2.  Pt. Requires moderate cuing to increase hip flexion/ step pattern to improve gait efficiency/ prevent shuffling pattern.  Standing balance (static/ dynamic reaching).   Several short seated rest breaks required.                 Pt response for medical necessity:  Benefits from B LE  strengthening/ endurance ex. Program to improve standing tolerance and safety with walking.  No LOB during tx. Session but requires B UE assist for all walking tasks.         PT Long Term Goals - 03/08/16 1757      PT LONG TERM GOAL #1   Title Pt. independent with HEP to increase B LE muscle strength to grossly 4/5 MMT to improve standing tolerance/ walking.    Baseline B LE muscle strength grossly 3/5 MMT   Time 4   Period Weeks   Status New     PT LONG TERM GOAL #2   Title Pt. able to ascend/ descend 4 steps use of 1 UE and step to pattern safely.     Baseline Difficulty stepping up with L or R LE on 6" step.     Time 4   Period Weeks   Status New     PT LONG TERM GOAL #3   Title Pt. able to ambulate 10 minutes with use of RW and consistent step pattern with no LOB to improve mobility.     Baseline limited standing/walking endurance   Time 4   Period Weeks   Status New     PT LONG TERM GOAL #4   Title Pt. will have no episodes of R knee buckling consistently for a  week to promote safety/ prevent fall risk.     Baseline Frequent episodes of R knee buckling with standing/walking   Time 4   Period Weeks   Status New               Plan - 03/21/16 0949    Clinical Impression Statement Several short seated rest breaks.  Pt. required cuing to posture/ positioning in walker and during hip flexion over thresholds.  No LOB during tx. session but extra time required, esp. with initial movements in standing.  Pt. reports fatigue t/o entire tx. and PT really encouraged pt./family to participate with daily walks.     Rehab Potential Fair   PT Frequency 2x / week   PT Duration 4 weeks   PT Treatment/Interventions ADLs/Self Care Home Management;DME Instruction;Gait training;Stair training;Functional mobility training;Therapeutic activities;Therapeutic exercise;Patient/family education;Neuromuscular re-education;Balance training;Manual techniques;Energy conservation;Passive range of  motion   PT Next Visit Plan Increase B LE muscle strength and overall muscle endurance/ functional mobility.    PT Home Exercise Plan See HEP   Consulted and Agree with Plan of Care Patient;Family member/caregiver      Patient will benefit from skilled therapeutic intervention in order to improve the following deficits and impairments:  Abnormal gait, Improper body mechanics, Pain, Postural dysfunction, Decreased coordination, Decreased activity tolerance, Decreased endurance, Decreased range of motion, Decreased strength, Difficulty walking, Decreased safety awareness, Decreased balance  Visit Diagnosis: Gait difficulty  Muscle weakness (generalized)  Chronic bilateral low back pain without sciatica  Abnormality of gait due to impairment of balance     Problem List There are no active problems to display for this patient.  Pura Spice, PT, DPT # [phone] 03/22/2016, 11:26 AM  [place] [facility] [address] [place], [name], [address] Phone: [phone]   Fax:  [phone]  Name: Cindy Tran MRN: [id number] Date of Birth: [birth year]

## 2016-03-23 ENCOUNTER — Ambulatory Visit: Payer: Medicare Other | Admitting: Physical Therapy

## 2016-03-23 DIAGNOSIS — R2689 Other abnormalities of gait and mobility: Secondary | ICD-10-CM

## 2016-03-23 DIAGNOSIS — M545 Low back pain: Secondary | ICD-10-CM

## 2016-03-23 DIAGNOSIS — R269 Unspecified abnormalities of gait and mobility: Secondary | ICD-10-CM

## 2016-03-23 DIAGNOSIS — M6281 Muscle weakness (generalized): Secondary | ICD-10-CM

## 2016-03-23 DIAGNOSIS — G8929 Other chronic pain: Secondary | ICD-10-CM

## 2016-03-23 NOTE — Therapy (Addendum)
[place] [facility] [address] [place], [name], [address] Phone: [phone]   Fax:  [phone]  Physical Therapy Treatment  Patient Details  Name: Cindy Tran MRN: [id number] Date of Birth: [birth year] Referring Provider: Rolene Arbour, FNP  Encounter Date: 03/23/2016      PT End of Session - 03/24/16 1131    Visit Number 6   Number of Visits 8   Date for PT Re-Evaluation 04/04/16   Authorization - Visit Number 6   Authorization - Number of Visits 10   PT Start Time [phone]   PT Stop Time 1041   PT Time Calculation (min) 58 min   Equipment Utilized During Treatment Gait belt   Activity Tolerance Patient tolerated treatment well;Patient limited by fatigue   Behavior During Therapy [facility] for tasks assessed/performed      No past medical history on file.  No past surgical history on file.  There were no vitals filed for this visit.      Subjective Assessment - 03/24/16 1131    Patient is accompained by: Family member   Limitations Standing;Walking;House hold activities   Patient Stated Goals Increase LE muscle strength to improve balance and gait.     Currently in Pain? No/denies      No falls.  No new complaints.  Pts. daugher states pt. has not returned to church at this time.  Pt. states she doesn't feel ready.     OBJECTIVE: There.ex.:  6 min. Walk test HR 74 bpm, O2 sat. 95%.  345 steps.  Tired with decrease BOS/ hip flexion/ step pattern with rollator.  Walking in //-bars forward/ lateral (B UE assist progressing to no UE).  Plinth step overs (posture issues/ cuing).  Seated knee/hip ex. With assist during hip flexion 20x.  Nustep L6 10 min. B UE/LE (warm-up/ no charge).  Standing ex.(no wts.)- 10x2 each with flexion/ abd./ ext.   Neuro:  NBOS with standing balance (EC/EO)- CGA for cuing/ safety. Lateral walking in //-bars with min. UE assist 5x L/R. 3" step ups with UE assist 10x2. Pt. Requires moderate cuing to  increase hip flexion/ step pattern to improve gait efficiency/ prevent shuffling pattern.  Standing balance (static/ dynamic reaching)- functional reaching with cones.  Several short seated rest breaks required.    Pt response for medical necessity: Benefits from B LE strengthening/ endurance ex. Program to improve standing tolerance and safety with walking. No LOB during tx. Session but requires B UE assist for all walking tasks.     Pt. remains easily fatigued with prolonged standing/walking tasks.  Pt. aware of forward head/ posture and able to partially correct without cuing.  Limited B hip flexion with sitting and walking tasks.         PT Long Term Goals - 03/08/16 1757      PT LONG TERM GOAL #1   Title Pt. independent with HEP to increase B LE muscle strength to grossly 4/5 MMT to improve standing tolerance/ walking.    Baseline B LE muscle strength grossly 3/5 MMT   Time 4   Period Weeks   Status New     PT LONG TERM GOAL #2   Title Pt. able to ascend/ descend 4 steps use of 1 UE and step to pattern safely.     Baseline Difficulty stepping up with L or R LE on 6" step.     Time 4   Period Weeks   Status New     PT  LONG TERM GOAL #3   Title Pt. able to ambulate 10 minutes with use of RW and consistent step pattern with no LOB to improve mobility.     Baseline limited standing/walking endurance   Time 4   Period Weeks   Status New     PT LONG TERM GOAL #4   Title Pt. will have no episodes of R knee buckling consistently for a week to promote safety/ prevent fall risk.     Baseline Frequent episodes of R knee buckling with standing/walking   Time 4   Period Weeks   Status New               Plan - 03/24/16 1131    Rehab Potential Fair   PT Frequency 2x / week   PT Treatment/Interventions ADLs/Self Care Home Management;DME Instruction;Gait training;Stair training;Functional mobility training;Therapeutic activities;Therapeutic  exercise;Patient/family education;Neuromuscular re-education;Balance training;Manual techniques;Energy conservation;Passive range of motion   PT Next Visit Plan Increase B LE muscle strength and overall muscle endurance/ functional mobility.    PT Home Exercise Plan See HEP   Consulted and Agree with Plan of Care Patient;Family member/caregiver      Patient will benefit from skilled therapeutic intervention in order to improve the following deficits and impairments:  Abnormal gait, Improper body mechanics, Pain, Postural dysfunction, Decreased coordination, Decreased activity tolerance, Decreased endurance, Decreased range of motion, Decreased strength, Difficulty walking, Decreased safety awareness, Decreased balance  Visit Diagnosis: Gait difficulty  Muscle weakness (generalized)  Chronic bilateral low back pain without sciatica  Abnormality of gait due to impairment of balance     Problem List There are no active problems to display for this patient.  Pura Spice, PT, DPT # [phone] 03/24/2016, 11:33 AM  [place] [facility] [address] [place], [name], [address] Phone: [phone]   Fax:  [phone]  Name: Cindy Tran MRN: [id number] Date of Birth: [birth year]

## 2016-03-28 ENCOUNTER — Ambulatory Visit: Payer: Medicare Other | Admitting: Physical Therapy

## 2016-03-28 DIAGNOSIS — R269 Unspecified abnormalities of gait and mobility: Secondary | ICD-10-CM

## 2016-03-28 DIAGNOSIS — M545 Low back pain, unspecified: Secondary | ICD-10-CM

## 2016-03-28 DIAGNOSIS — G8929 Other chronic pain: Secondary | ICD-10-CM

## 2016-03-28 DIAGNOSIS — R2689 Other abnormalities of gait and mobility: Secondary | ICD-10-CM

## 2016-03-28 DIAGNOSIS — M6281 Muscle weakness (generalized): Secondary | ICD-10-CM

## 2016-03-29 NOTE — Therapy (Signed)
[place] [facility] [address] [place], [name], [address] Phone: [phone]   Fax:  [phone]  Physical Therapy Treatment  Patient Details  Name: Cindy Tran MRN: [id number] Date of Birth: 1930/09/27 Referring Provider: Rolene Arbour, FNP  Encounter Date: 03/28/2016      PT End of Session - 03/29/16 1712    Visit Number 7   Number of Visits 8   Date for PT Re-Evaluation 04/04/16   Authorization - Visit Number 7   Authorization - Number of Visits 10   PT Start Time [phone]   PT Stop Time 1040   PT Time Calculation (min) 52 min   Equipment Utilized During Treatment Gait belt   Activity Tolerance Patient tolerated treatment well;Patient limited by fatigue   Behavior During Therapy [facility] for tasks assessed/performed      No past medical history on file.  No past surgical history on file.  There were no vitals filed for this visit.      Subjective Assessment - 03/29/16 1711    Subjective Pt. states she had a nice Christmas.  No new complaints.  Pt. continues to feel weak in B LE.     Limitations Standing;Walking;House hold activities   Patient Stated Goals Increase LE muscle strength to improve balance and gait.     Currently in Pain? No/denies       OBJECTIVE:  Nustep L6 15 min. B UE/LE (less assist to place R and L LE on foot pedals).  Sit to stands from green chair with cuing/ assist for proper technique.  Seated hip abd./add. Manual isometrics (10x each with hold).  Seated and standing hip flexion 10x2 (min. To mod. Assist during seated hip flexion).  LAQ/ heel raises/ toe raises.  Standing hip abd. With visual cuing on floor  (cones).  Neuro:  Walking in clinic with use of rollator and gait in //-bars.  Forward/ lateral walking in //-bars with cuing for posture/ mirror feedback (seated rest breaks requested/ required). Step ups/ downs on 6" step and stairs.  Discussed community ambulation with use of rollator and dtr. To  restaurants and shopping.     Pt response for medical necessity:  Pt. Benefits from generalized LE strength training and gait training to improve community mobility and safety.  Several short seated rest breaks due to muscle fatigue and weakness in LE.  No episodes of LE/ knee buckling.        PT Long Term Goals - 03/08/16 1757      PT LONG TERM GOAL #1   Title Pt. independent with HEP to increase B LE muscle strength to grossly 4/5 MMT to improve standing tolerance/ walking.    Baseline B LE muscle strength grossly 3/5 MMT   Time 4   Period Weeks   Status New     PT LONG TERM GOAL #2   Title Pt. able to ascend/ descend 4 steps use of 1 UE and step to pattern safely.     Baseline Difficulty stepping up with L or R LE on 6" step.     Time 4   Period Weeks   Status New     PT LONG TERM GOAL #3   Title Pt. able to ambulate 10 minutes with use of RW and consistent step pattern with no LOB to improve mobility.     Baseline limited standing/walking endurance   Time 4   Period Weeks   Status New     PT LONG TERM  GOAL #4   Title Pt. will have no episodes of R knee buckling consistently for a week to promote safety/ prevent fall risk.     Baseline Frequent episodes of R knee buckling with standing/walking   Time 4   Period Weeks   Status New           Plan - 03/29/16 1713    Clinical Impression Statement B hip flexor muscle weakness remains primary weakness issue.  Pt. is improving ability to lift R LE onto foot pedal of Nustep and get in car but will require extra time/ PT or caregiver assist if fatigue present.  No LOB or buckling on B knees/LE during tx. session and gait around PT clinic with use of rollator.  Pt. more independent with use of rollator and walking longer distances without breaks.  PT discussed the ability of pt. to be able to walk from car into local restaurants.     Rehab Potential Fair   PT Frequency 2x / week   PT Duration 4 weeks   PT  Treatment/Interventions ADLs/Self Care Home Management;DME Instruction;Gait training;Stair training;Functional mobility training;Therapeutic activities;Therapeutic exercise;Patient/family education;Neuromuscular re-education;Balance training;Manual techniques;Energy conservation;Passive range of motion   PT Next Visit Plan Increase B LE muscle strength and overall muscle endurance/ functional mobility.   RECHECK GOALS next tx.     PT Home Exercise Plan See HEP   Consulted and Agree with Plan of Care Patient;Family member/caregiver      Patient will benefit from skilled therapeutic intervention in order to improve the following deficits and impairments:  Abnormal gait, Improper body mechanics, Pain, Postural dysfunction, Decreased coordination, Decreased activity tolerance, Decreased endurance, Decreased range of motion, Decreased strength, Difficulty walking, Decreased safety awareness, Decreased balance  Visit Diagnosis: Gait difficulty  Muscle weakness (generalized)  Chronic bilateral low back pain without sciatica  Abnormality of gait due to impairment of balance     Problem List There are no active problems to display for this patient.  Pura Spice, PT, DPT # [phone] 03/29/2016, 5:21 PM  [place] [facility] [address] [place], [name], [address] Phone: [phone]   Fax:  [phone]  Name: Cindy Tran MRN: [id number] Date of Birth: 23-Sep-1930

## 2016-03-30 ENCOUNTER — Ambulatory Visit: Payer: Medicare Other | Admitting: Physical Therapy

## 2016-03-30 DIAGNOSIS — G8929 Other chronic pain: Secondary | ICD-10-CM

## 2016-03-30 DIAGNOSIS — M545 Low back pain, unspecified: Secondary | ICD-10-CM

## 2016-03-30 DIAGNOSIS — R269 Unspecified abnormalities of gait and mobility: Secondary | ICD-10-CM

## 2016-03-30 DIAGNOSIS — R2689 Other abnormalities of gait and mobility: Secondary | ICD-10-CM

## 2016-03-30 DIAGNOSIS — M6281 Muscle weakness (generalized): Secondary | ICD-10-CM

## 2016-03-30 NOTE — Therapy (Addendum)
[place] [facility] [address] [place], [name], [address] Phone: [phone]   Fax:  [phone]  Physical Therapy Treatment  Patient Details  Name: Cindy Tran MRN: [id number] Date of Birth: 10-28-1930 Referring Provider: Rolene Arbour, FNP  Encounter Date: 03/30/2016    No past medical history on file.  No past surgical history on file.  There were no vitals filed for this visit.    Pt. states she did some walking this week and had several visitors causing her to be more active.  Pt. has not returned to church.      Therex:  Seated ex./ Standing hip flexion with 1 UE assist/touch 10x (difficulty).  Forward/ backwards walking in //-bars with B UE assist progressing to 1 UE assist.  Standing hip flexion and abduction with CGA.  Neuro:  Step touches with heel taps/ 3" and 6" step ups.  Turning CW/ CCW 1x3 each.  Walking in hallway with use of rollator working on endurance/ consistent step pattern/ cadence.  Nustep L6 10 min. B UE/LE (no charge).     Recert nex tx. session/ update G-codes.  Pt. ambulates with limited B hip/knee flexion during swingthrough phase of gait with heavy use of B UE for balance/ safety.  Pt. very fearful of knee buckling which limits step pattern/ heel strike.  No episodes of knee buckling.  4 seated rest breaks.         PT Long Term Goals - 03/08/16 1757      PT LONG TERM GOAL #1   Title Pt. independent with HEP to increase B LE muscle strength to grossly 4/5 MMT to improve standing tolerance/ walking.    Baseline B LE muscle strength grossly 3/5 MMT   Time 4   Period Weeks   Status New     PT LONG TERM GOAL #2   Title Pt. able to ascend/ descend 4 steps use of 1 UE and step to pattern safely.     Baseline Difficulty stepping up with L or R LE on 6" step.     Time 4   Period Weeks   Status New     PT LONG TERM GOAL #3   Title Pt. able to ambulate 10 minutes with use of RW and consistent step  pattern with no LOB to improve mobility.     Baseline limited standing/walking endurance   Time 4   Period Weeks   Status New     PT LONG TERM GOAL #4   Title Pt. will have no episodes of R knee buckling consistently for a week to promote safety/ prevent fall risk.     Baseline Frequent episodes of R knee buckling with standing/walking   Time 4   Period Weeks   Status New         Patient will benefit from skilled therapeutic intervention in order to improve the following deficits and impairments:  Abnormal gait, Improper body mechanics, Pain, Postural dysfunction, Decreased coordination, Decreased activity tolerance, Decreased endurance, Decreased range of motion, Decreased strength, Difficulty walking, Decreased safety awareness, Decreased balance  Visit Diagnosis: Muscle weakness (generalized)  Gait difficulty  Chronic bilateral low back pain without sciatica  Abnormality of gait due to impairment of balance     Problem List There are no active problems to display for this patient.  Pura Spice, PT, DPT # [phone] 04/16/2016, 3:29 PM  [place] [facility] [address] [place], [name], [address]  Phone: [phone]   Fax:  [phone]  Name: Cindy Tran MRN: [id number] Date of Birth: [birth year]

## 2016-04-05 ENCOUNTER — Ambulatory Visit: Payer: Medicare Other | Attending: Gastroenterology | Admitting: Physical Therapy

## 2016-04-05 DIAGNOSIS — M6281 Muscle weakness (generalized): Secondary | ICD-10-CM | POA: Diagnosis present

## 2016-04-05 DIAGNOSIS — M545 Low back pain: Secondary | ICD-10-CM | POA: Diagnosis present

## 2016-04-05 DIAGNOSIS — R269 Unspecified abnormalities of gait and mobility: Secondary | ICD-10-CM

## 2016-04-05 DIAGNOSIS — R2689 Other abnormalities of gait and mobility: Secondary | ICD-10-CM | POA: Insufficient documentation

## 2016-04-05 DIAGNOSIS — G8929 Other chronic pain: Secondary | ICD-10-CM | POA: Insufficient documentation

## 2016-04-06 NOTE — Therapy (Addendum)
[place] [facility] [address] [place], [name], [address] Phone: [phone]   Fax:  [phone]  Physical Therapy Treatment  Patient Details  Name: Cindy Tran MRN: [id number] Date of Birth: Oct 02, 1930 Referring Provider: Rolene Arbour, FNP  Encounter Date: 04/05/2016        PT End of Session - 04/06/16 1835    Visit Number 9   Number of Visits 16   Date for PT Re-Evaluation 05/03/16   Authorization - Visit Number 9   Authorization - Number of Visits 18   PT Start Time 1520   PT Stop Time 6962   PT Time Calculation (min) 54 min   Equipment Utilized During Treatment Gait belt   Activity Tolerance Patient tolerated treatment well;Patient limited by fatigue   Behavior During Therapy [facility] for tasks assessed/performed       No past medical history on file.  No past surgical history on file.  There were no vitals filed for this visit.    Pt. reports feeling tired today.  Pt. remains fairly inactive at home and pts. daughter states that pt. has not returned to going to church at this time.  Pt. states she is doing HEP and wants to get stronger to return to PLOF.  No c/o pain or LOB reported.   Pt. continues to use rollator with all aspects of gait in home/ outside.      OBJECTIVE:  Nustep L6 15 min. B UE/LE (less assist to place R and L LE on foot pedals).  Sit to stands from green chair with cuing/ assist for proper technique.  Seated hip abd./add. Manual isometrics (10x each with hold).  Seated and standing hip flexion 10x2 (min. To mod. Assist during seated hip flexion).  LAQ/ heel raises/ toe raises.  Standing hip abd. With visual cuing on floor  (cones).  Neuro:  Walking in clinic with use of rollator and gait in //-bars (focusing on consistent step pattern/ cadence).  Forward/ lateral walking in //-bars with cuing for posture/ mirror feedback (seated rest breaks requested/ required). Step ups/ downs on 6" step and stairs.   Discussed community ambulation with use of rollator and dtr. To restaurants and shopping.                Pt response for medical necessity:  Pt. Benefits from generalized LE strength training and gait training to improve community mobility and safety.  Several short seated rest breaks due to muscle fatigue and weakness in LE.  No episodes of LE/ knee buckling.      Pt. works hard during PT tx. session and pushes herself to improve hip flexion in sitting/standing.  Pt. has shown slow but consistent progress towards PT goals.  Pt. benefits from use of rollator with all aspects of mobility.  Pt. has limited B hip flexor muscle strength which results in a more shuffling/ antalgic gait pattern.  Moderate cuing to improve upright posture/ step pattern and walking endurance.  No falls reported since staring PT .           PT Long Term Goals - 04/06/16 1549      PT LONG TERM GOAL #1   Title Pt. independent with HEP to increase B LE muscle strength to grossly 4/5 MMT to improve standing tolerance/ walking.    Baseline B LE muscle strength grossly 3+/5 MMT   Time 4   Period Weeks   Status Partially Met     PT LONG TERM  GOAL #2   Title Pt. able to ascend/ descend 4 steps use of 1 UE and step to pattern safely.     Baseline requires use of B UE assist for safety/ limited hip flexion   Time 4   Period Weeks   Status Partially Met     PT LONG TERM GOAL #3   Title Pt. able to ambulate 10 minutes with use of RW and consistent step pattern with no LOB to improve mobility.     Baseline limited standing/walking endurance  (improving since initial evaluation)- benefits from rollator.    Time 4   Period Weeks   Status Not Met     PT LONG TERM GOAL #4   Title Pt. will have no episodes of R knee buckling consistently for a week to promote safety/ prevent fall risk.     Baseline Frequent episodes of R knee buckling with standing/walking   Time 4   Period Weeks   Status Not Met         Patient will benefit from skilled therapeutic intervention in order to improve the following deficits and impairments:  Abnormal gait, Improper body mechanics, Pain, Postural dysfunction, Decreased coordination, Decreased activity tolerance, Decreased endurance, Decreased range of motion, Decreased strength, Difficulty walking, Decreased safety awareness, Decreased balance  Visit Diagnosis: Gait difficulty  Muscle weakness (generalized)  Chronic bilateral low back pain without sciatica  Abnormality of gait due to impairment of balance     Problem List There are no active problems to display for this patient.  Pura Spice, PT, DPT # [phone] 04/16/2016, 3:52 PM  [place] [facility] [address] [place], [name], [address] Phone: [phone]   Fax:  [phone]  Name: Eileene Kisling MRN: [id number] Date of Birth: [birth year]

## 2016-04-07 ENCOUNTER — Ambulatory Visit: Payer: Medicare Other | Admitting: Physical Therapy

## 2016-04-11 ENCOUNTER — Ambulatory Visit: Payer: Medicare Other | Admitting: Physical Therapy

## 2016-04-11 DIAGNOSIS — G8929 Other chronic pain: Secondary | ICD-10-CM

## 2016-04-11 DIAGNOSIS — R269 Unspecified abnormalities of gait and mobility: Secondary | ICD-10-CM

## 2016-04-11 DIAGNOSIS — R2689 Other abnormalities of gait and mobility: Secondary | ICD-10-CM

## 2016-04-11 DIAGNOSIS — M6281 Muscle weakness (generalized): Secondary | ICD-10-CM

## 2016-04-11 DIAGNOSIS — M545 Low back pain: Secondary | ICD-10-CM
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
## 2016-04-13 ENCOUNTER — Ambulatory Visit: Payer: Medicare Other | Admitting: Physical Therapy

## 2016-04-13 DIAGNOSIS — R269 Unspecified abnormalities of gait and mobility: Secondary | ICD-10-CM

## 2016-04-13 DIAGNOSIS — R2689 Other abnormalities of gait and mobility: Secondary | ICD-10-CM

## 2016-04-13 DIAGNOSIS — M6281 Muscle weakness (generalized): Secondary | ICD-10-CM

## 2016-04-14 NOTE — Therapy (Addendum)
[facility] [address] [place], [name], [address] Phone: [phone]   Fax:  [phone]  Physical Therapy Treatment  Patient Details  Name: [place] Sarinana MRN: [id number] Date of Birth: 09-Jan-1931 Referring Provider: Rolene Arbour, FNP  Encounter Date: 04/13/2016        PT End of Session - 04/13/16 1255    Visit Number 11   Number of Visits 16   Date for PT Re-Evaluation 05/03/16   Authorization - Visit Number 11   Authorization - Number of Visits 18   PT Start Time 0905   PT Stop Time 1003   PT Time Calculation (min) 58 min   Equipment Utilized During Treatment Gait belt   Activity Tolerance Patient tolerated treatment well;Patient limited by fatigue   Behavior During Therapy [facility] for tasks assessed/performed       No past medical history on file.  No past surgical history on file.  There were no vitals filed for this visit.    Pt. reports she feels good today but is tired. Pt reports no pain today.    Therex: Seated ex./ Standing hip flexion with 1 UE assist/touch 20x (fatigue). Forward/ backwards walking in //-bars with B UE assist progressing to 1 UE assist. Step touches with heel taps/ 3" and 6" step ups. Step ups on 3" plinth with Airex (requires B UE assist for safety).  6 MWT (245 feet)- decrease since last attempt due to moderate LE fatigue today.  Neuro: Turning CW/ CCW 1x3 each. Nustep L6 10 min.  Berg: 25/56 (significant fall risk)- difficulty with all dynamic tasks.    PT cuing to pt. To avoid hip circumduction with step ups.  Pt. Remains limited with B hip flexion.  Significant muscle fatigue and several rest breaks required.    Pt. had some LOB and B knee buckling during tx. today and was significantly limited by fatigue. Pt. completed 6 min walk test with 245 ft. and the Berg Balance test with a score of 25. Pt. showed B hip flexor weakness during standing step-touches with cones on ground for  cuing. Pt ambulated with single UE assist and PT CGA in //-bars and showed some fatigue with standing hip flex. exercises        PT Long Term Goals - 04/06/16 1549      PT LONG TERM GOAL #1   Title Pt. independent with HEP to increase B LE muscle strength to grossly 4/5 MMT to improve standing tolerance/ walking.    Baseline B LE muscle strength grossly 3+/5 MMT   Time 4   Period Weeks   Status Partially Met     PT LONG TERM GOAL #2   Title Pt. able to ascend/ descend 4 steps use of 1 UE and step to pattern safely.     Baseline requires use of B UE assist for safety/ limited hip flexion   Time 4   Period Weeks   Status Partially Met     PT LONG TERM GOAL #3   Title Pt. able to ambulate 10 minutes with use of RW and consistent step pattern with no LOB to improve mobility.     Baseline limited standing/walking endurance  (improving since initial evaluation)- benefits from rollator.    Time 4   Period Weeks   Status Not Met     PT LONG TERM GOAL #4   Title Pt. will have no episodes of R knee buckling consistently for a week to promote  safety/ prevent fall risk.     Baseline Frequent episodes of R knee buckling with standing/walking   Time 4   Period Weeks   Status Not Met         Patient will benefit from skilled therapeutic intervention in order to improve the following deficits and impairments:  Abnormal gait, Improper body mechanics, Pain, Postural dysfunction, Decreased coordination, Decreased activity tolerance, Decreased endurance, Decreased range of motion, Decreased strength, Difficulty walking, Decreased safety awareness, Decreased balance  Visit Diagnosis: Gait difficulty  Muscle weakness (generalized)  Abnormality of gait due to impairment of balance     Problem List There are no active problems to display for this patient.  Pura Spice, PT, DPT # [id number] Willodean Rosenthal, SPT 04/16/2016, 5:11 PM  [place] [facility] [address] [place], [name], [address] Phone: [phone]   Fax:  [phone]  Name: Charlean Carneal MRN: [id number] Date of Birth: 24-Dec-1930

## 2016-04-16 NOTE — Addendum Note (Signed)
Addended by: Pura Spice on: 04/16/2016 04:02 PM   Modules accepted: Orders

## 2016-04-17 ENCOUNTER — Ambulatory Visit: Payer: Medicare Other | Admitting: Physical Therapy

## 2016-04-17 DIAGNOSIS — M6281 Muscle weakness (generalized): Secondary | ICD-10-CM

## 2016-04-17 DIAGNOSIS — G8929 Other chronic pain: Secondary | ICD-10-CM

## 2016-04-17 DIAGNOSIS — M545 Low back pain, unspecified: Secondary | ICD-10-CM

## 2016-04-17 DIAGNOSIS — R269 Unspecified abnormalities of gait and mobility: Secondary | ICD-10-CM

## 2016-04-17 DIAGNOSIS — R2689 Other abnormalities of gait and mobility: Secondary | ICD-10-CM

## 2016-04-17 NOTE — Therapy (Signed)
[facility] [address] [place], [name], [address] Phone: [phone]   Fax:  [phone]  Physical Therapy Treatment  Patient Details  Name: Cindy Tran MRN: [id number] Date of Birth: 1930-04-09 Referring Provider: Rolene Arbour, FNP  Encounter Date: 04/17/2016      PT End of Session - 04/17/16 1233    Visit Number 12   Number of Visits 16   Date for PT Re-Evaluation 05/03/16   Authorization - Visit Number 12   Authorization - Number of Visits 18   PT Start Time 0951   PT Stop Time 1048   PT Time Calculation (min) 57 min   Equipment Utilized During Treatment Gait belt   Activity Tolerance Patient tolerated treatment well;Patient limited by fatigue   Behavior During Therapy [facility] for tasks assessed/performed      No past medical history on file.  No past surgical history on file.  There were no vitals filed for this visit.      Subjective Assessment - 04/17/16 1232    Subjective Pt. did not go to church yesterday but states that she is planning on going this weekend, if the weather is nice.  This is the first time the pt. expressed interest in returning to church.     Patient is accompained by: Family member   Limitations Standing;Walking;House hold activities   Patient Stated Goals Increase LE muscle strength to improve balance and gait.     Currently in Pain? No/denies      Therex: Seated ex./ Standing hip flexion with 1 UE assist/touch 20x (fatigue).  Sit to stand from green chair with transfer to mat table.  Ascending/ descending 4 stairs with step to gait and B UE assist 3x.  Walking in clinic/ hallway with cuing for posture/ increase BOS.  Walking partial lunges in //-bars 10x L/R.  Neuro: Turning CW/ CCW 1x3 each.  Static/dynmaic balance tasks: standing with no UE assist (scap. Retraction/ sh. Flexion/ bicep curls). Nustep L6 10 min.    PT cuing to pt. To avoid hip circumduction with step ups. Pt.  Remains limited with B hip flexion. Significant muscle fatigue and several rest breaks required.        PT Long Term Goals - 04/06/16 1549      PT LONG TERM GOAL #1   Title Pt. independent with HEP to increase B LE muscle strength to grossly 4/5 MMT to improve standing tolerance/ walking.    Baseline B LE muscle strength grossly 3+/5 MMT   Time 4   Period Weeks   Status Partially Met     PT LONG TERM GOAL #2   Title Pt. able to ascend/ descend 4 steps use of 1 UE and step to pattern safely.     Baseline requires use of B UE assist for safety/ limited hip flexion   Time 4   Period Weeks   Status Partially Met     PT LONG TERM GOAL #3   Title Pt. able to ambulate 10 minutes with use of RW and consistent step pattern with no LOB to improve mobility.     Baseline limited standing/walking endurance  (improving since initial evaluation)- benefits from rollator.    Time 4   Period Weeks   Status Not Met     PT LONG TERM GOAL #4   Title Pt. will have no episodes of R knee buckling consistently for a week to promote safety/ prevent fall risk.     Baseline  Frequent episodes of R knee buckling with standing/walking   Time 4   Period Weeks   Status Not Met               Plan - 04/17/16 1234    Clinical Impression Statement Good standing balance with standing B shoulder flexion/ abd./ scap. retraction for 4.5 min.  No LOB and improved confidence with wt. shifting from chair to mat table.  Pt. able to ascend/ descend 4 steps with step to pattern and B UE assist on handrails.  Modrerate LE muscle fatigue with step ups in quads but no LOB.  Pt. instructed several times during tx. session to increase BOS/ step pattern with gait.     Rehab Potential Fair   PT Frequency 2x / week   PT Duration 4 weeks   PT Treatment/Interventions ADLs/Self Care Home Management;DME Instruction;Gait training;Stair training;Functional mobility training;Therapeutic activities;Therapeutic  exercise;Patient/family education;Neuromuscular re-education;Balance training;Manual techniques;Energy conservation;Passive range of motion   PT Next Visit Plan Increase B LE muscle strength and overall muscle endurance/ functional mobility.      PT Home Exercise Plan See HEP   Consulted and Agree with Plan of Care Patient;Family member/caregiver      Patient will benefit from skilled therapeutic intervention in order to improve the following deficits and impairments:  Abnormal gait, Improper body mechanics, Pain, Postural dysfunction, Decreased coordination, Decreased activity tolerance, Decreased endurance, Decreased range of motion, Decreased strength, Difficulty walking, Decreased safety awareness, Decreased balance  Visit Diagnosis: Gait difficulty  Muscle weakness (generalized)  Abnormality of gait due to impairment of balance  Chronic bilateral low back pain without sciatica     Problem List There are no active problems to display for this patient.  Pura Spice, PT, DPT # [id number] 04/17/2016, 12:38 PM  [facility] [address] [place], [name], [address] Phone: [phone]   Fax:  [phone]  Name: Cindy Tran MRN: [id number] Date of Birth: April 09, 1930

## 2016-04-19 ENCOUNTER — Encounter: Payer: Medicare Other | Admitting: Physical Therapy

## 2016-04-25 ENCOUNTER — Ambulatory Visit: Payer: Medicare Other | Admitting: Physical Therapy

## 2016-04-25 DIAGNOSIS — R2689 Other abnormalities of gait and mobility: Secondary | ICD-10-CM

## 2016-04-25 DIAGNOSIS — M6281 Muscle weakness (generalized): Secondary | ICD-10-CM

## 2016-04-25 DIAGNOSIS — R269 Unspecified abnormalities of gait and mobility: Secondary | ICD-10-CM | POA: Diagnosis not present

## 2016-04-25 NOTE — Therapy (Signed)
[facility] [facility] [address] [place], [name], [address] Phone: [phone]   Fax:  [phone]  Physical Therapy Treatment  Patient Details  Name: Cindy Tran MRN: [id number] Date of Birth: Aug 11, 1930 Referring Provider: Rolene Arbour, FNP  Encounter Date: 04/25/2016      PT End of Session - 04/25/16 1129    Visit Number 13   Number of Visits 16   Date for PT Re-Evaluation 05/03/16   Authorization - Visit Number 13   Authorization - Number of Visits 18   PT Start Time [phone]   PT Stop Time 1050   PT Time Calculation (min) 58 min   Equipment Utilized During Treatment Gait belt   Activity Tolerance Patient tolerated treatment well;Patient limited by fatigue   Behavior During Therapy [facility] for tasks assessed/performed      No past medical history on file.  No past surgical history on file.  There were no vitals filed for this visit.      Subjective Assessment - 04/25/16 0957    Subjective Pt. had a good weekend but did not make it to church this weekend due to the weather. The pt. reports she liked watching it snow last week.    Patient is accompained by: Family member   Limitations Standing;Walking;House hold activities   Patient Stated Goals Increase LE muscle strength to improve balance and gait.     Currently in Pain? No/denies      Therex:  Walking in hallway - pt. cued to exaggerate B hip flex. And heel strike. 70 ft x2 Ascending/descending stairs w/ B UE support and step-to pattern - 2x. Pt showing improvement w/ B hip flexion during step-up Sit to stands -B UE assist 5x. Some fatigue noted by last rep. Reviewed seated/standing hip ex. Program with min. UE assist.   Scifit L5 5 min.  (pt. Request Nustep instead of Scifit).  Nustep L6 10 min (no charge)   Neuro: Standing bicep curls 10x2 (2#) on blue airex - CGA from PT  Standing overhead reaches for cones with CGA. Pt cued to weight shift to reach further  cones Weight shifting & partial squat on airex - 10x. Good B hip/knee flexion noted. Equal weight distribution on airex.  Pt. Tolerates tx but shows moderate fatigue during exercise. Pt. Requires frequent rest breaks but worked hard today to improve gait pattern and B hip flexion while ascending stairs.         PT Long Term Goals - 04/06/16 1549      PT LONG TERM GOAL #1   Title Pt. independent with HEP to increase B LE muscle strength to grossly 4/5 MMT to improve standing tolerance/ walking.    Baseline B LE muscle strength grossly 3+/5 MMT   Time 4   Period Weeks   Status Partially Met     PT LONG TERM GOAL #2   Title Pt. able to ascend/ descend 4 steps use of 1 UE and step to pattern safely.     Baseline requires use of B UE assist for safety/ limited hip flexion   Time 4   Period Weeks   Status Partially Met     PT LONG TERM GOAL #3   Title Pt. able to ambulate 10 minutes with use of RW and consistent step pattern with no LOB to improve mobility.     Baseline limited standing/walking endurance  (improving since initial evaluation)- benefits from rollator.    Time 4   Period  Weeks   Status Not Met     PT LONG TERM GOAL #4   Title Pt. will have no episodes of R knee buckling consistently for a week to promote safety/ prevent fall risk.     Baseline Frequent episodes of R knee buckling with standing/walking   Time 4   Period Weeks   Status Not Met               Plan - 04/25/16 1131    Clinical Impression Statement Pt. completed standing reaches for cones on top shelf and showed good weight shifting and B UE reaching. R shoulder overhead flexion ROM < L shoulder. Pt. ambulated 140 ft. in hallway w/ rollator and a reciprocal gait pattern. Pt demonstrated improved posture during gait w/o PT cuing. Pt. fatigued while ascending 4 times but able to complete 2x with B UE assist. Pt cued to use LEs to control ascent/descent.    Rehab Potential Fair   PT Frequency 2x /  week   PT Duration 4 weeks   PT Treatment/Interventions ADLs/Self Care Home Management;DME Instruction;Gait training;Stair training;Functional mobility training;Therapeutic activities;Therapeutic exercise;Patient/family education;Neuromuscular re-education;Balance training;Manual techniques;Energy conservation;Passive range of motion   PT Next Visit Plan Increase B LE muscle strength and overall muscle endurance/ functional mobility.   CHECK GOALS/ SCHEDULE NEXT TX.   PT Home Exercise Plan See HEP   Consulted and Agree with Plan of Care Patient;Family member/caregiver      Patient will benefit from skilled therapeutic intervention in order to improve the following deficits and impairments:  Abnormal gait, Improper body mechanics, Pain, Postural dysfunction, Decreased coordination, Decreased activity tolerance, Decreased endurance, Decreased range of motion, Decreased strength, Difficulty walking, Decreased safety awareness, Decreased balance  Visit Diagnosis: Gait difficulty  Muscle weakness (generalized)  Abnormality of gait due to impairment of balance     Problem List There are no active problems to display for this patient.  Pura Spice, PT, DPT # [id number] Willodean Rosenthal, SPT 04/25/2016, 12:45 PM  [place] [facility] [address] [place], [name], [address] Phone: [phone]   Fax:  [phone]  Name: Cindy Tran MRN: [id number] Date of Birth: 12-20-1930

## 2016-04-27 ENCOUNTER — Ambulatory Visit: Payer: Medicare Other | Admitting: Physical Therapy

## 2016-04-27 DIAGNOSIS — R269 Unspecified abnormalities of gait and mobility: Secondary | ICD-10-CM

## 2016-04-27 DIAGNOSIS — R2689 Other abnormalities of gait and mobility: Secondary | ICD-10-CM

## 2016-04-27 DIAGNOSIS — M6281 Muscle weakness (generalized): Secondary | ICD-10-CM

## 2016-04-27 DIAGNOSIS — M545 Low back pain: Secondary | ICD-10-CM

## 2016-04-27 DIAGNOSIS — G8929 Other chronic pain: Secondary | ICD-10-CM

## 2016-04-28 NOTE — Therapy (Signed)
[facility] [address] [place], [name], [address] Phone: [phone]   Fax:  [phone]  Physical Therapy Treatment  Patient Details  Name: Cindy Tran MRN: [id number] Date of Birth: 1930-09-07 Referring Provider: Rolene Arbour, FNP  Encounter Date: 04/27/2016      PT End of Session - 04/28/16 1520    Visit Number 14   Number of Visits 16   Date for PT Re-Evaluation 05/03/16   Authorization - Visit Number 14   Authorization - Number of Visits 18   PT Start Time 0950   PT Stop Time 1046   PT Time Calculation (min) 56 min   Equipment Utilized During Treatment Gait belt   Activity Tolerance Patient tolerated treatment well;Patient limited by fatigue   Behavior During Therapy [facility] for tasks assessed/performed      No past medical history on file.  No past surgical history on file.  There were no vitals filed for this visit.      Subjective Assessment - 04/28/16 1518    Subjective Pt. reports no new complaints.  Pts. dtr. expressed concern with pts. compliance with HEP/ daily activity.  Pt. is going to try to get pt. to attend Church this Sunday.   Patient is accompained by: Family member   Limitations Standing;Walking;House hold activities   Patient Stated Goals Increase LE muscle strength to improve balance and gait.     Currently in Pain? No/denies      Therex:  Seated: hip flexion/ LAQ/ hip adduction with ball (feedback provided t/o). Standing hip flexion/ abd. 20x each. Walking in hallway with rollator - pt. cued to exaggerate B hip flex. And heel strike. 70 ft x2 Ascending/descending stairs w/ B UE support and step-to pattern - 2x. Pt showing improvement w/ B hip flexion during step-up Sit to stands -B UE assist 5x. Significant UE assist provided.  Nustep L6 10 min (no charge)   Neuro: Alt. UE/LE in seated posture (working on coordination/ hip flexion). Standing bicep curls 10x2 (2#) on blue airex - CGA  from PT  Standing overhead reaches for cones with CGA. Pt cued to weight shift to reach further cones Turning CW/CCW in //-bars with no UE assist (min. A for verbal/tactile cuing and safety).  Gait training: Amb. In //-bars with no UE assist working on step pattern/ maintaining consistent BOS.  Limited hip flexion/ heel strike.  No LOB but slow, antalgic gait pattern.  Pt. Benefit from use of rollator with all standing/ walking tasks.    Pt. Tolerates tx but shows moderate fatigue during exercise. Pt. Requires frequent rest breaks but worked hard today to improve gait pattern and B hip flexion while ascending stairs.        PT Long Term Goals - 04/06/16 1549      PT LONG TERM GOAL #1   Title Pt. independent with HEP to increase B LE muscle strength to grossly 4/5 MMT to improve standing tolerance/ walking.    Baseline B LE muscle strength grossly 3+/5 MMT   Time 4   Period Weeks   Status Partially Met     PT LONG TERM GOAL #2   Title Pt. able to ascend/ descend 4 steps use of 1 UE and step to pattern safely.     Baseline requires use of B UE assist for safety/ limited hip flexion   Time 4   Period Weeks   Status Partially Met     PT LONG TERM GOAL #3  Title Pt. able to ambulate 10 minutes with use of RW and consistent step pattern with no LOB to improve mobility.     Baseline limited standing/walking endurance  (improving since initial evaluation)- benefits from rollator.    Time 4   Period Weeks   Status Not Met     PT LONG TERM GOAL #4   Title Pt. will have no episodes of R knee buckling consistently for a week to promote safety/ prevent fall risk.     Baseline Frequent episodes of R knee buckling with standing/walking   Time 4   Period Weeks   Status Not Met           Plan - 04/28/16 1521    Clinical Impression Statement Pt. remain limited with seated/ standing hip flexion progression which limited step length/pattern/heel strike with use of rollator.  Pt.  requires rest breaks with prolonged standing/ ther.ex./ gait activities with use of rollator in clinic.  Pt. able to ambulate 20 feet with shuffling gait pattern and no assistive device without LOB in clinic.  PT really encouraging pt./ family to attempt some community activities out of the house this weekend.     Rehab Potential Fair   PT Frequency 2x / week   PT Duration 4 weeks   PT Treatment/Interventions ADLs/Self Care Home Management;DME Instruction;Gait training;Stair training;Functional mobility training;Therapeutic activities;Therapeutic exercise;Patient/family education;Neuromuscular re-education;Balance training;Manual techniques;Energy conservation;Passive range of motion   PT Next Visit Plan Increase B LE muscle strength and overall muscle endurance/ functional mobility.   1 more tx. session.  Discuss status/ goals.     PT Home Exercise Plan See HEP   Consulted and Agree with Plan of Care Patient;Family member/caregiver      Patient will benefit from skilled therapeutic intervention in order to improve the following deficits and impairments:  Abnormal gait, Improper body mechanics, Pain, Postural dysfunction, Decreased coordination, Decreased activity tolerance, Decreased endurance, Decreased range of motion, Decreased strength, Difficulty walking, Decreased safety awareness, Decreased balance  Visit Diagnosis: Gait difficulty  Muscle weakness (generalized)  Abnormality of gait due to impairment of balance  Chronic bilateral low back pain without sciatica     Problem List There are no active problems to display for this patient.  Pura Spice, PT, DPT # [id number] 04/28/2016, 3:37 PM  [place] [facility] [address] [place], [name], [address] Phone: [phone]   Fax:  [phone]  Name: Darlena Koval MRN: [id number] Date of Birth: 1930-10-07

## 2016-05-02 ENCOUNTER — Ambulatory Visit: Payer: Medicare Other | Admitting: Physical Therapy

## 2016-05-02 DIAGNOSIS — M6281 Muscle weakness (generalized): Secondary | ICD-10-CM

## 2016-05-02 DIAGNOSIS — M545 Low back pain, unspecified: Secondary | ICD-10-CM

## 2016-05-02 DIAGNOSIS — R2689 Other abnormalities of gait and mobility: Secondary | ICD-10-CM

## 2016-05-02 DIAGNOSIS — R269 Unspecified abnormalities of gait and mobility: Secondary | ICD-10-CM | POA: Diagnosis not present

## 2016-05-02 DIAGNOSIS — G8929 Other chronic pain: Secondary | ICD-10-CM

## 2016-05-03 NOTE — Therapy (Signed)
[facility] [address] [place], [name], [address] Phone: [phone]   Fax:  [phone]  Physical Therapy Treatment  Patient Details  Name: Cindy Tran MRN: [id number] Date of Birth: 1930-06-27 Referring Provider: Rolene Arbour, FNP  Encounter Date: 05/02/2016      PT End of Session - 05/03/16 1733    Visit Number 15   Number of Visits 16   Date for PT Re-Evaluation 05/03/16   Authorization - Visit Number 15   Authorization - Number of Visits 18   PT Start Time 1007   PT Stop Time 1102   PT Time Calculation (min) 55 min   Equipment Utilized During Treatment Gait belt   Activity Tolerance Patient tolerated treatment well;Patient limited by fatigue   Behavior During Therapy [facility] for tasks assessed/performed      No past medical history on file.  No past surgical history on file.  There were no vitals filed for this visit.      Subjective Assessment - 05/03/16 1724    Subjective Pt. states she is doing okay.  Pt. went to Urgent Care on Satuday due to neck cyst infection/ drainage.  Pt. had cyst lanced and currently bandaged with dressing.     Patient is accompained by: Family member   Limitations Standing;Walking;House hold activities   Patient Stated Goals Increase LE muscle strength to improve balance and gait.     Currently in Pain? No/denies      Therex:  Seated: hip flexion/ LAQ/ hip adduction with ball (feedback provided t/o). Standing hip flexion/ abd. 20x each.  Sit to stands -B UE assist 5x. Significant UE assist provided.  Nustep L6 10 min (no charge).  Partial step lunges L/R 5x2 each with use of //-bars/ B UE assist.   Neuro: Alt. UE/LE in seated posture (working on coordination/ hip flexion). Standing bicep curls10x2(2#) on blue airex - CGA from PT  Standing overhead reaches for coneswith CGA. Pt cued to weight shift to reach further cones Turning CW/CCW in //-bars with no UE assist (min. A for  verbal/tactile cuing and safety).  Gait training: Walking in hallway with rollator - pt. cued to exaggerate B hip flex./BOS And heel strike. 70 ft x2 Amb. In //-bars with no UE assist working on step pattern/ maintaining consistent BOS.  Limited hip flexion/ heel strike.  No LOB but slow, antalgic gait pattern.  Pt. Benefit from use of rollator with all standing/ walking tasks.   Ascending/descending stairs w/ B UE support and step-to pattern - 2x. Pt showing improvement w/ B hip flexion during step-up   Pt. Tolerates tx but shows moderate fatigue during exercise. Pt. Requires frequent rest breaks but worked hard today to improve gait pattern and B hip flexion while ascending stairs.        PT Long Term Goals - 04/06/16 1549      PT LONG TERM GOAL #1   Title Pt. independent with HEP to increase B LE muscle strength to grossly 4/5 MMT to improve standing tolerance/ walking.    Baseline B LE muscle strength grossly 3+/5 MMT   Time 4   Period Weeks   Status Partially Met     PT LONG TERM GOAL #2   Title Pt. able to ascend/ descend 4 steps use of 1 UE and step to pattern safely.     Baseline requires use of B UE assist for safety/ limited hip flexion   Time 4   Period Weeks  Status Partially Met     PT LONG TERM GOAL #3   Title Pt. able to ambulate 10 minutes with use of RW and consistent step pattern with no LOB to improve mobility.     Baseline limited standing/walking endurance  (improving since initial evaluation)- benefits from rollator.    Time 4   Period Weeks   Status Not Met     PT LONG TERM GOAL #4   Title Pt. will have no episodes of R knee buckling consistently for a week to promote safety/ prevent fall risk.     Baseline Frequent episodes of R knee buckling with standing/walking   Time 4   Period Weeks   Status Not Met            Plan - 05/03/16 1733    Clinical Impression Statement Pt. able to ascend/ descend 4 steps with step to pattern/ B UE support  but circumduction noted due to limited hip flexion.  L/R LE muscle strength: hip flex. (3/3), abd. (3+/4), add. (3+/4), knee ext. (4/4), knee flex. (4-/4), DF (4/4), PF (4/4).  Limited B hip flexion with seated/ standing ex. and gait pattern with use of rollator in clinic.  Pt. requires moderate cuing to increase BOS/ heel strike/ upright posture with most tasks.      Rehab Potential Fair   PT Frequency 2x / week   PT Duration 4 weeks   PT Treatment/Interventions ADLs/Self Care Home Management;DME Instruction;Gait training;Stair training;Functional mobility training;Therapeutic activities;Therapeutic exercise;Patient/family education;Neuromuscular re-education;Balance training;Manual techniques;Energy conservation;Passive range of motion   PT Next Visit Plan Increase B LE muscle strength and overall muscle endurance/ functional mobility.   RECERT next tx. session.  PT discussed pt. status with pt./dtr. and she will continue to benefit from skilled PT services 2x/week for several weeks with progression to more independent ex./ walking with family.        Patient will benefit from skilled therapeutic intervention in order to improve the following deficits and impairments:  Abnormal gait, Improper body mechanics, Pain, Postural dysfunction, Decreased coordination, Decreased activity tolerance, Decreased endurance, Decreased range of motion, Decreased strength, Difficulty walking, Decreased safety awareness, Decreased balance  Visit Diagnosis: Gait difficulty  Muscle weakness (generalized)  Abnormality of gait due to impairment of balance  Chronic bilateral low back pain without sciatica     Problem List There are no active problems to display for this patient.  Pura Spice, PT, DPT # [id number] 05/03/2016, 5:40 PM  [facility] [address] [place], [name], [address] Phone: [phone]   Fax:  [phone]  Name: Cindy Tran MRN:  [id number] Date of Birth: [birth year]

## 2016-05-04 ENCOUNTER — Ambulatory Visit: Payer: Medicare Other | Attending: Gastroenterology | Admitting: Physical Therapy

## 2016-05-04 DIAGNOSIS — R2689 Other abnormalities of gait and mobility: Secondary | ICD-10-CM | POA: Insufficient documentation

## 2016-05-04 DIAGNOSIS — M6281 Muscle weakness (generalized): Secondary | ICD-10-CM | POA: Diagnosis present

## 2016-05-04 DIAGNOSIS — R269 Unspecified abnormalities of gait and mobility: Secondary | ICD-10-CM | POA: Insufficient documentation

## 2016-05-04 DIAGNOSIS — G8929 Other chronic pain: Secondary | ICD-10-CM | POA: Insufficient documentation

## 2016-05-04 DIAGNOSIS — M545 Low back pain: Secondary | ICD-10-CM | POA: Diagnosis present

## 2016-05-04 NOTE — Therapy (Signed)
[facility] [address] [place], [name], [address] Phone: [phone]   Fax:  [phone]  Physical Therapy Treatment  Patient Details  Name: Cindy Tran MRN: [id number] Date of Birth: [birth year] Referring Provider: Rolene Arbour, FNP  Encounter Date: 05/04/2016      PT End of Session - 05/04/16 1537    Visit Number 16   Number of Visits 23   Date for PT Re-Evaluation 05/31/16   Authorization - Visit Number 16   Authorization - Number of Visits 26   PT Start Time 1038   PT Stop Time 1142   PT Time Calculation (min) 64 min   Equipment Utilized During Treatment Gait belt   Activity Tolerance Patient tolerated treatment well;Patient limited by fatigue   Behavior During Therapy [facility] for tasks assessed/performed      No past medical history on file.  No past surgical history on file.  There were no vitals filed for this visit.      Subjective Assessment - 05/04/16 1536    Subjective Pt. states she is doing okay with cyst still showing some signs of infection. Pt. reports she will be attending a funeral tomorrow and is planning to go to church on Sunday   Patient is accompained by: Family member   Limitations Standing;Walking;House hold activities   Patient Stated Goals Increase LE muscle strength to improve balance and gait.     Currently in Pain? No/denies       Therex:  Seated: hip flexion/ LAQ/ hip adduction with ball (feedback provided t/o). Standing hip flexion 20x. Sit to stands -B UE assist 5x. Significant UE assist provided.  Standing marches B UE assist. Pt. Showing increased B hip flexion strength.  Nustep L6 10 min (no charge).   Neuro: Standing bicep curls10x2(2#) on blue airex -CGA from PT  Step-over's (3" step) in //-bars B UE assist. Cuing for hip flex. provided Standing bicep curls 1# dumbbells CGA. No LOB noted here.  Gait training: Walking in clinic with rollator-pt. cued to exaggerate B  hip flex./BOS And heel strike. 40 ft x2  Amb. In //-bars with no UE assist working on step pattern/ maintaining consistent BOS. Limited hip flexion/ heel strike. No LOB but slow, antalgic gait pattern. Pt. Benefit from use of rollator with all standing/ walking tasks.  Ascending/descending stairs w/ B UE support and step-to pattern -2x. Pt showing improvement w/ B hip flexion during step-up. Pt. Fatigued after 2 trials.    Pt. Tolerates tx but shows moderate fatigue during exercise. Pt. Requires frequent rest breaks but worked hard today to improve gait pattern and B hip flexion while ascending stairs.         PT Long Term Goals - 05/04/16 1622      PT LONG TERM GOAL #1   Title Pt. independent with HEP to increase B LE muscle strength to grossly 4/5 MMT to improve standing tolerance/ walking.    Baseline B LE muscle strength grossly 3+/5 MMT   Time 4   Period Weeks   Status Not Met     PT LONG TERM GOAL #2   Title Pt. able to ascend/ descend 4 steps use of 1 UE and step to pattern safely.     Baseline requires use of B UE assist for safety/ limited hip flexion but able ascend stairs safely   Time 4   Period Weeks   Status Partially Met     PT LONG TERM GOAL #3  Title Pt. able to ambulate 10 minutes with use of RW and consistent step pattern with no LOB to improve mobility.     Baseline limited standing/walking endurance  (improving since initial evaluation)- benefits from rollator.    Time 4   Period Weeks   Status Partially Met     PT LONG TERM GOAL #4   Title Pt. will have no episodes of R knee buckling consistently for a week to promote safety/ prevent fall risk.     Baseline Less frequent episodes of R knee buckling with standing/walking   Time 4   Period Weeks   Status Achieved     PT LONG TERM GOAL #5   Title Pt. will be able to walk into church from car to church pew with rollator with SBA and no LOB to improve mobility   Baseline Pt. has not been to church  since 02/2016   Time 4   Period Weeks   Status New            Plan - 2016/05/18 1541    Clinical Impression Statement Pt. showing slow progress with B hip flexion strength and was able to ambulate all the way to the car in parking lot after tx session. Pt. requires mod. cuing for B heel strike during ambulation with rollator in clinic but has been self-correcting posture during standing hip exercises in parallel bars. Pt. still working on carry over between clinic and life at home. Pt. has been trying to attend church for the first time in three months and is planning to go this Sunday. Pt will benefit from continued skilled PT in order to reinforce proper gait mechanics, increase base of support, and promote safety during household activities.   Pt. will benefit from continued skilled PT to improve strength/ safety/ mod. independence with rollator to improve mobility.     Rehab Potential Fair   PT Frequency 2x / week   PT Duration 4 weeks   PT Treatment/Interventions ADLs/Self Care Home Management;DME Instruction;Gait training;Stair training;Functional mobility training;Therapeutic activities;Therapeutic exercise;Patient/family education;Neuromuscular re-education;Balance training;Manual techniques;Energy conservation;Passive range of motion   PT Next Visit Plan Increase B LE muscle strength and overall muscle endurance/ functional mobility.   ISSUE NEW HEP   PT Home Exercise Plan See HEP   Consulted and Agree with Plan of Care Patient;Family member/caregiver      Patient will benefit from skilled therapeutic intervention in order to improve the following deficits and impairments:  Abnormal gait, Improper body mechanics, Pain, Postural dysfunction, Decreased coordination, Decreased activity tolerance, Decreased endurance, Decreased range of motion, Decreased strength, Difficulty walking, Decreased safety awareness, Decreased balance  Visit Diagnosis: Gait difficulty  Muscle weakness  (generalized)  Abnormality of gait due to impairment of balance       G-Codes - 18-May-2016 1633    Functional Assessment Tool Used Clinical impression/ gait difficulty/ pain/ muscle weakness   Functional Limitation Mobility: Walking and moving around   Mobility: Walking and Moving Around Current Status ([id number]) At least 40 percent but less than 60 percent impaired, limited or restricted   Mobility: Walking and Moving Around Goal Status [phone]) At least 20 percent but less than 40 percent impaired, limited or restricted      Problem List There are no active problems to display for this patient.  Pura Spice, PT, DPT # [id number] Willodean Rosenthal, SPT 05-18-2016, 4:34 PM  [place] [address] [place], [name], [address] Phone: [phone]  Fax:  [phone]  Name: Annice Jolly MRN: [id number] Date of Birth: [birth year]

## 2016-05-09 ENCOUNTER — Ambulatory Visit: Payer: Medicare Other | Admitting: Physical Therapy

## 2016-05-09 DIAGNOSIS — M545 Low back pain: Secondary | ICD-10-CM

## 2016-05-09 DIAGNOSIS — R269 Unspecified abnormalities of gait and mobility: Secondary | ICD-10-CM

## 2016-05-09 DIAGNOSIS — M6281 Muscle weakness (generalized): Secondary | ICD-10-CM

## 2016-05-09 DIAGNOSIS — R2689 Other abnormalities of gait and mobility: Secondary | ICD-10-CM

## 2016-05-09 DIAGNOSIS — G8929 Other chronic pain: Secondary | ICD-10-CM

## 2016-05-09 NOTE — Therapy (Signed)
[place] [facility] [address] [place], [name], [address] Phone: [phone]   Fax:  [phone]  Physical Therapy Treatment  Patient Details  Name: Cindy Tran MRN: [id number] Date of Birth: 12/08/1930 Referring Provider: Rolene Arbour, FNP  Encounter Date: 05/09/2016      PT End of Session - 05/09/16 0943    Visit Number 17   Number of Visits 23   Date for PT Re-Evaluation 05/31/16   Authorization - Visit Number 17   Authorization - Number of Visits 26   PT Start Time 0943   PT Stop Time 1057   PT Time Calculation (min) 74 min   Equipment Utilized During Treatment Gait belt;Other (comment)   Activity Tolerance Patient tolerated treatment well;Patient limited by fatigue;Other (comment)   Behavior During Therapy [facility] for tasks assessed/performed      No past medical history on file.  No past surgical history on file.  There were no vitals filed for this visit.      Subjective Assessment - 05/09/16 0943    Subjective Pt. states she has been out of breathe since waking up this morning. Patient reports going to a funeral last friday and church this sunday.    Patient is accompained by: Family member   Limitations Standing;Walking;House hold activities   Patient Stated Goals Increase LE muscle strength to improve balance and gait.     Currently in Pain? No/denies     There Ex: Standing marches in // bars w BU assist 2x20. Verbal cues for upright posture Standing 1lb curls 2x10. Verbal cues for upright posture. Side stepping in // bars multiple trials with standing rest breaks for SOB Nustep Lv6 10 min (no charge)  Neuro Re-ed Step over's (3" step) in // bars with BUE assist. Cues for hip flexion (compensates with circumduction). Step ups 6 inch step in // bars with BUE assist. Cues for hip flexion (compensates with circumduction) Blue foam: standing with 2 finger assist, weight shifting side to side with BUE assist. Cues  for upright posture and bending knee  Gait:  Ambulating in // bars with BUE assist. Reassessed ambulatory mechanics post step over interventions. Verbal cues for body mechanics multiple trials. Walking in clinic and outside with rollator and CGA with Min A at walker during downhill portion outside. Walking length of car with use of car side and rollator with CGA.  Pt. Tolerates tx but shows moderate fatigue during exercise due to SOB. Pt. Requires frequent rest breaks but worked hard today to improve gait pattern and body mechanics when stepping over and onto step.      PT Long Term Goals - 05/04/16 1622      PT LONG TERM GOAL #1   Title Pt. independent with HEP to increase B LE muscle strength to grossly 4/5 MMT to improve standing tolerance/ walking.    Baseline B LE muscle strength grossly 3+/5 MMT   Time 4   Period Weeks   Status Not Met     PT LONG TERM GOAL #2   Title Pt. able to ascend/ descend 4 steps use of 1 UE and step to pattern safely.     Baseline requires use of B UE assist for safety/ limited hip flexion but able ascend stairs safely   Time 4   Period Weeks   Status Partially Met     PT LONG TERM GOAL #3   Title Pt. able to ambulate 10 minutes with use of RW and consistent  step pattern with no LOB to improve mobility.     Baseline limited standing/walking endurance  (improving since initial evaluation)- benefits from rollator.    Time 4   Period Weeks   Status Partially Met     PT LONG TERM GOAL #4   Title Pt. will have no episodes of R knee buckling consistently for a week to promote safety/ prevent fall risk.     Baseline Less frequent episodes of R knee buckling with standing/walking   Time 4   Period Weeks   Status Achieved     PT LONG TERM GOAL #5   Title Pt. will be able to walk into church from car to church pew with rollator with SBA and no LOB to improve mobility   Baseline Pt. has not been to church since 02/2016   Time 4   Period Weeks   Status  New            Plan - 05/09/16 5498    Clinical Impression Statement Patient had difficulty breathing today and was continuously monitored with pulse ox for Sp02 with Sp02 ranging between 84-98 with better saturation noted in standing and during exercise. Patient required more standing rest breaks for breathing this session but was able to demonstrate good endurance throughout session for standing. Compensation for LE weakness was noted with step overs and step ups with leg circumducting rather than flexing in a linear pattern.  Patient required frequent cueing for upright posture throughout session. After step ups patient ambulated in parallel bars and had better gait mechanics. Patient walked to car with rollator and CGA and upon reaching car used side of car and rollator to reach door. Patient would benefit from continued skilled physical therapy to improve capacity for functional endurance, global strengthening, balance, and ability to interact with environment.     Rehab Potential Fair   PT Frequency 2x / week   PT Duration 4 weeks   PT Treatment/Interventions ADLs/Self Care Home Management;DME Instruction;Gait training;Stair training;Functional mobility training;Therapeutic activities;Therapeutic exercise;Patient/family education;Neuromuscular re-education;Balance training;Manual techniques;Energy conservation;Passive range of motion   PT Next Visit Plan Increase B LE muscle strength and overall muscle endurance/ functional mobility.   ISSUE NEW HEP   PT Home Exercise Plan See HEP   Consulted and Agree with Plan of Care Patient;Family member/caregiver      Patient will benefit from skilled therapeutic intervention in order to improve the following deficits and impairments:  Abnormal gait, Improper body mechanics, Pain, Postural dysfunction, Decreased coordination, Decreased activity tolerance, Decreased endurance, Decreased range of motion, Decreased strength, Difficulty walking, Decreased  safety awareness, Decreased balance  Visit Diagnosis: Gait difficulty  Muscle weakness (generalized)  Abnormality of gait due to impairment of balance  Chronic bilateral low back pain without sciatica     Problem List There are no active problems to display for this patient.  Pura Spice, PT, DPT # [id number] Janna Arch, SPT 05/09/2016, 11:30 AM  [place] [facility] [address] [place] [address] [place], [name], [address] Phone: [phone]   Fax:  [phone]  Name: Cindy Tran MRN: [id number] Date of Birth: 1930/12/07

## 2016-05-11 ENCOUNTER — Ambulatory Visit: Payer: Medicare Other | Admitting: Physical Therapy

## 2016-05-11 DIAGNOSIS — R2689 Other abnormalities of gait and mobility: Secondary | ICD-10-CM

## 2016-05-11 DIAGNOSIS — R269 Unspecified abnormalities of gait and mobility: Secondary | ICD-10-CM | POA: Diagnosis not present

## 2016-05-11 DIAGNOSIS — M6281 Muscle weakness (generalized): Secondary | ICD-10-CM

## 2016-05-11 NOTE — Therapy (Signed)
[facility] [address] [place], [name], [address] Phone: [phone]   Fax:  [phone]  Physical Therapy Treatment  Patient Details  Name: Cindy Tran MRN: [id number] Date of Birth: May 21, 1930 Referring Provider: Rolene Arbour, FNP  Encounter Date: 05/11/2016      PT End of Session - 05/11/16 1159    Visit Number 18   Number of Visits 23   Date for PT Re-Evaluation 05/31/16   Authorization - Visit Number 18   Authorization - Number of Visits 26   PT Start Time 0942   PT Stop Time 1045   PT Time Calculation (min) 63 min   Equipment Utilized During Treatment Gait belt;Other (comment)   Activity Tolerance Patient tolerated treatment well;Patient limited by fatigue;Other (comment)   Behavior During Therapy WFL for tasks assessed/performed      No past medical history on file.  No past surgical history on file.  There were no vitals filed for this visit.      Subjective Assessment - 05/11/16 0946    Subjective Pt. reports no shortness of breath this morning and plans to go to church again this weekend.    Patient is accompained by: Family member   Limitations Standing;Walking;House hold activities   Patient Stated Goals Increase LE muscle strength to improve balance and gait.     Currently in Pain? No/denies     OBJECTIVE Therex: forward walking in //-bars progression: B UE assist to single UE assist to no UE assist. Pt. Showed good posture today with min. Cuing needed. Pt. Also had good step length and looked steady on her feet.  Ambulation in //-bars with 3" step clearance, R hand assist. Pt. Is able to clear both LE's with no leg circumduction today during hip flexion.  Step ups onto 3" step B UE assist to single UE assist. 10x R/L, pt. Req. Less frequent rest breaks Sit to stands 10x no UE assist, some loss of control on descend with B knee valgus. Pt. Able to control until last 10 deg. Of sit  Seated marching  and long arc quads 10x each R/L with tactile cues to kick SPT hand Ambulation to parked car at end of tx session (~4 min)  Nustep L6 10 min. (no charge) consistent cadence and no rest breaks   Neuro:  airex weight shifting in //-bars, 30 sec bouts x5. Pt. Was able maintain ankle control with good posture and no posterior trunk lean or assist of //-bars.  Narrow base of support balancing on airex 10 sec holds x5. Pt. More challenged by this task but only req. CGA and occasional fingertip hand hold on //-bar.  Multiple trials ambulating in gym with rollator, showing more consistent step pattern today and improved B heelstrike  Pt. Tolerated tx. Well today and maintained safe and normal levels of Sp02% throughout tx. Pt. Maintained good body mechanics throughout and will benefit from continued PT to increase BOS, increase endurance during ambulation, and increase LE strength to decrease fall risk and increase independence with ADLs and community ambulation.        PT Long Term Goals - 05/04/16 1622      PT LONG TERM GOAL #1   Title Pt. independent with HEP to increase B LE muscle strength to grossly 4/5 MMT to improve standing tolerance/ walking.    Baseline B LE muscle strength grossly 3+/5 MMT   Time 4   Period Weeks   Status Not Met  PT LONG TERM GOAL #2   Title Pt. able to ascend/ descend 4 steps use of 1 UE and step to pattern safely.     Baseline requires use of B UE assist for safety/ limited hip flexion but able ascend stairs safely   Time 4   Period Weeks   Status Partially Met     PT LONG TERM GOAL #3   Title Pt. able to ambulate 10 minutes with use of RW and consistent step pattern with no LOB to improve mobility.     Baseline limited standing/walking endurance  (improving since initial evaluation)- benefits from rollator.    Time 4   Period Weeks   Status Partially Met     PT LONG TERM GOAL #4   Title Pt. will have no episodes of R knee buckling consistently for a  week to promote safety/ prevent fall risk.     Baseline Less frequent episodes of R knee buckling with standing/walking   Time 4   Period Weeks   Status Achieved     PT LONG TERM GOAL #5   Title Pt. will be able to walk into church from car to church pew with rollator with SBA and no LOB to improve mobility   Baseline Pt. has not been to church since 02/2016   Time 4   Period Weeks   Status New             Plan - 05/11/16 1222    Clinical Impression Statement Pt. worked hard today during tx session and showed good body mechanics and standing posture during ambulation and therex tasks today. Pt. req. less rest breaks today and was able to demonstrate obstacle navigation through the parallel bars with single UE assist. Ambulation in parallel bars is improving with no hand assist on bars, with consistent heelstrike and step cadence today. Pt. still fatigues quickly during seated hip exercises and responded well to tactile and verbal cuing to keep up efforts. Pt. Tolerated tx. Well today and maintained safe and normal levels of Sp02% throughout tx. Pt. Maintained good body mechanics throughout and will benefit from continued PT to increase BOS, increase endurance during ambulation, and increase LE strength to decrease fall risk and increase independence with ADLs and community ambulation.   Rehab Potential Fair   PT Frequency 2x / week   PT Duration 4 weeks   PT Treatment/Interventions ADLs/Self Care Home Management;DME Instruction;Gait training;Stair training;Functional mobility training;Therapeutic activities;Therapeutic exercise;Patient/family education;Neuromuscular re-education;Balance training;Manual techniques;Energy conservation;Passive range of motion   PT Next Visit Plan Increase B LE muscle strength and overall muscle endurance/ functional mobility. ISSUE NEW HEP   PT Home Exercise Plan See HEP   Consulted and Agree with Plan of Care Patient;Family member/caregiver      Patient  will benefit from skilled therapeutic intervention in order to improve the following deficits and impairments:  Abnormal gait, Improper body mechanics, Pain, Postural dysfunction, Decreased coordination, Decreased activity tolerance, Decreased endurance, Decreased range of motion, Decreased strength, Difficulty walking, Decreased safety awareness, Decreased balance  Visit Diagnosis: Gait difficulty  Muscle weakness (generalized)  Abnormality of gait due to impairment of balance     Problem List There are no active problems to display for this patient.  Pura Spice, PT, DPT # [id number] Willodean Rosenthal, SPT 05/11/2016, 6:07 PM  [facility] [address] [place], [name], [address] Phone: [phone]   Fax:  [phone]  Name: Kylieann Eagles MRN: [id number] Date of Birth:  11/19/1930   

## 2016-05-16 ENCOUNTER — Ambulatory Visit: Payer: Medicare Other | Admitting: Physical Therapy

## 2016-05-18 ENCOUNTER — Ambulatory Visit: Payer: Medicare Other | Admitting: Physical Therapy

## 2016-05-18 DIAGNOSIS — R269 Unspecified abnormalities of gait and mobility: Secondary | ICD-10-CM

## 2016-05-18 DIAGNOSIS — R2689 Other abnormalities of gait and mobility: Secondary | ICD-10-CM

## 2016-05-18 DIAGNOSIS — M6281 Muscle weakness (generalized): Secondary | ICD-10-CM

## 2016-05-18 NOTE — Therapy (Signed)
[facility] [address] [place], [name], [address] Phone: [phone]   Fax:  [phone]  Physical Therapy Treatment  Patient Details  Name: Cindy Tran MRN: [id number] Date of Birth: 1930-05-18 Referring Provider: Rolene Arbour, FNP  Encounter Date: 05/18/2016      PT End of Session - 05/18/16 1251    Visit Number 19   Number of Visits 23   Date for PT Re-Evaluation 05/31/16   Authorization - Visit Number 19   Authorization - Number of Visits 26   PT Start Time 0944   PT Stop Time 1048   PT Time Calculation (min) 64 min   Equipment Utilized During Treatment Gait belt;Other (comment)   Activity Tolerance Patient tolerated treatment well;Patient limited by fatigue;Other (comment)   Behavior During Therapy WFL for tasks assessed/performed      No past medical history on file.  No past surgical history on file.  There were no vitals filed for this visit.      Subjective Assessment - 05/18/16 1250    Subjective Pt. reports no new complaints. Epidermoid cyst removal on neck healing nicely with no bandage covering today. Pt. plans to get to church again this weekend fo 3rd weekend in a row.    Patient is accompained by: Family member   Limitations Standing;Walking;House hold activities   Patient Stated Goals Increase LE muscle strength to improve balance and gait.     Currently in Pain? No/denies     OBJECTIVE:  There.Ex.:  forward, backward, sideways walking in //-bars with CGA single UE assist.  toe taps on 6" step w/ fingertip assist on //-bars. Pt. Showed good B hip control and did not fatigue as easily as in past tx sessions. Pt. Completed 20x on R/L.   Neuro:  Narrow base balance on airex w/ CGA. Pt. Demonstrated improvement from last visit and was able to hold balance for longer periods of time (>3 sec. Independently).  Wide base balance on airex Standing marching on airex pad  Ambulating in hallway 210 ft.  No rest breaks Ascend/descend stairs w/ B UE assist and step-to pattern Nustep L6 10 min (no charge).     Pt. Tolerated tx. Well today and maintained safe and normal levels of Sp02% throughout tx. Pt. Maintained good body mechanics throughout and will benefit from continued PT to increase BOS, increase endurance during ambulation, and increase LE strength to decrease fall risk and increase independence with ADLs and community ambulation.          PT Long Term Goals - 05/04/16 1622      PT LONG TERM GOAL #1   Title Pt. independent with HEP to increase B LE muscle strength to grossly 4/5 MMT to improve standing tolerance/ walking.    Baseline B LE muscle strength grossly 3+/5 MMT   Time 4   Period Weeks   Status Not Met     PT LONG TERM GOAL #2   Title Pt. able to ascend/ descend 4 steps use of 1 UE and step to pattern safely.     Baseline requires use of B UE assist for safety/ limited hip flexion but able ascend stairs safely   Time 4   Period Weeks   Status Partially Met     PT LONG TERM GOAL #3   Title Pt. able to ambulate 10 minutes with use of RW and consistent step pattern with no LOB to improve mobility.     Baseline limited standing/walking  endurance  (improving since initial evaluation)- benefits from rollator.    Time 4   Period Weeks   Status Partially Met     PT LONG TERM GOAL #4   Title Pt. will have no episodes of R knee buckling consistently for a week to promote safety/ prevent fall risk.     Baseline Less frequent episodes of R knee buckling with standing/walking   Time 4   Period Weeks   Status Achieved     PT LONG TERM GOAL #5   Title Pt. will be able to walk into church from car to church pew with rollator with SBA and no LOB to improve mobility   Baseline Pt. has not been to church since 02/2016   Time 4   Period Weeks   Status New            Plan - 05/18/16 1252    Clinical Impression Statement Pt. continues to work hard and tolerated tx.  well today with less sitting breaks and improved ability to tolerate standing. Pt. ambulated with rollator 210 ft. in hallway with no rest breaks demonstrating improved B heestrike, posture, and consistency of cadence. Pt. able to complete standing balance activities on airex mat with varying levels of difficulty with CGA from SPT and ascended/descended stairs with step-to pattern at end of session. Pt. encouraged to eat lunch with daughter at her favorite restaurant next week to encourage getting out of the house more. Pt. will benefit from skilled PT in order to continue increasing endurance during ambulation, gross LE strengthening, and balance to decrease risk of falls and increase functional mobility.    Rehab Potential Fair   PT Frequency 2x / week   PT Duration 4 weeks   PT Treatment/Interventions ADLs/Self Care Home Management;DME Instruction;Gait training;Stair training;Functional mobility training;Therapeutic activities;Therapeutic exercise;Patient/family education;Neuromuscular re-education;Balance training;Manual techniques;Energy conservation;Passive range of motion   PT Next Visit Plan Increase B LE muscle strength and overall muscle endurance/ functional mobility. ISSUE NEW HEP   PT Home Exercise Plan See HEP   Consulted and Agree with Plan of Care Patient;Family member/caregiver      Patient will benefit from skilled therapeutic intervention in order to improve the following deficits and impairments:  Abnormal gait, Improper body mechanics, Pain, Postural dysfunction, Decreased coordination, Decreased activity tolerance, Decreased endurance, Decreased range of motion, Decreased strength, Difficulty walking, Decreased safety awareness, Decreased balance  Visit Diagnosis: Gait difficulty  Muscle weakness (generalized)  Abnormality of gait due to impairment of balance     Problem List There are no active problems to display for this patient.  Pura Spice, PT, DPT #  [id number] Willodean Rosenthal, SPT 05/19/2016, 10:46 AM  [name], [name], [address] Phone: [phone]   Fax:  [phone]  Name: Cindy Tran MRN: [id number] Date of Birth: 1930-07-26

## 2016-05-23 ENCOUNTER — Ambulatory Visit: Payer: Medicare Other | Admitting: Physical Therapy

## 2016-05-23 DIAGNOSIS — G8929 Other chronic pain: Secondary | ICD-10-CM

## 2016-05-23 DIAGNOSIS — R269 Unspecified abnormalities of gait and mobility: Secondary | ICD-10-CM

## 2016-05-23 DIAGNOSIS — M545 Low back pain, unspecified: Secondary | ICD-10-CM

## 2016-05-23 DIAGNOSIS — R2689 Other abnormalities of gait and mobility: Secondary | ICD-10-CM

## 2016-05-23 DIAGNOSIS — M6281 Muscle weakness (generalized): Secondary | ICD-10-CM

## 2016-05-23 NOTE — Therapy (Signed)
[facility] [address] [place], [name], [address] Phone: [phone]   Fax:  [phone]  Physical Therapy Treatment  Patient Details  Name: Cindy Tran MRN: [id number] Date of Birth: 10-08-1930 Referring Provider: Rolene Arbour, FNP  Encounter Date: 05/23/2016      PT End of Session - 05/23/16 0956    Visit Number 20   Number of Visits 23   Date for PT Re-Evaluation 05/31/16   Authorization - Visit Number 20   Authorization - Number of Visits 26   PT Start Time 0940   PT Stop Time 3568   PT Time Calculation (min) 63 min   Equipment Utilized During Treatment Gait belt;Other (comment)   Activity Tolerance Patient tolerated treatment well;Patient limited by fatigue;Other (comment)   Behavior During Therapy WFL for tasks assessed/performed      No past medical history on file.  No past surgical history on file.  There were no vitals filed for this visit.      Subjective Assessment - 05/23/16 0957    Subjective Patient reports no new concerns. Patient went to church this weekend for the 3rd weekend in a row. A little bit of intermittent back pain is noted due to weather.    Patient is accompained by: Family member   Limitations Standing;Walking;House hold activities   Patient Stated Goals Increase LE muscle strength to improve balance and gait.     Currently in Pain? Yes   Pain Score 1    Pain Location Back   Pain Orientation Mid;Lower   Pain Descriptors / Indicators Aching   Pain Frequency Occasional     Gait training: Parallel bars: Walking with BUE support 4x. Walking single UE support 2x.  Stepping over 3" steps in // bars with BUE support and CGA 6x. Excessive forward trunk sway with step over.  Ambulating in hallway w/ rollator 7 min. With no breaks. Pt. Ambulated 31 ft. With 2 inch plank on floor between feet to encourage wider BOS during gait. Pt. Had harder time initiating consistent heelstrike but  completed the task with no LOB. Pt. Ambulated to car from PT clinic at end of session with CGA.   There.Ex.: Four cone tap in // bars. Cues for upright posture. Posterior trunk lean with occasional knocking over of cones. Multiple trials.  Stand to sit with focus on controlled eccentric sit down not flopping.   Knee extension 1x5 each leg. Cues for height and strength of quadriceps contraction.  Hip flexion 5x each leg.  Hip abduction seated RTB 10x Hip adduction seated with ball 10x   Pt. will benefit from continued PT to increase BOS, increase endurance during ambulation, and increase LE strength to decrease fall risk and increase independence with ADLs and community ambulation.          PT Long Term Goals - 05/04/16 1622      PT LONG TERM GOAL #1   Title Pt. independent with HEP to increase B LE muscle strength to grossly 4/5 MMT to improve standing tolerance/ walking.    Baseline B LE muscle strength grossly 3+/5 MMT   Time 4   Period Weeks   Status Not Met     PT LONG TERM GOAL #2   Title Pt. able to ascend/ descend 4 steps use of 1 UE and step to pattern safely.     Baseline requires use of B UE assist for safety/ limited hip flexion but able ascend stairs safely  Time 4   Period Weeks   Status Partially Met     PT LONG TERM GOAL #3   Title Pt. able to ambulate 10 minutes with use of RW and consistent step pattern with no LOB to improve mobility.     Baseline limited standing/walking endurance  (improving since initial evaluation)- benefits from rollator.    Time 4   Period Weeks   Status Partially Met     PT LONG TERM GOAL #4   Title Pt. will have no episodes of R knee buckling consistently for a week to promote safety/ prevent fall risk.     Baseline Less frequent episodes of R knee buckling with standing/walking   Time 4   Period Weeks   Status Achieved     PT LONG TERM GOAL #5   Title Pt. will be able to walk into church from car to church pew with rollator  with SBA and no LOB to improve mobility   Baseline Pt. has not been to church since 02/2016   Time 4   Period Weeks   Status New           Plan - 05/23/16 0955    Clinical Impression Statement Patient presents to therapy session today with continued community ambulation. Patient continues to require cues for posture in standing and ambulating. When patient ambulates she compensates with excessive trunk lean with object negotiation. Tight upper trapezius muscles are noted due to postural compensation when ambulating.  Patient continues to progress in duration standing and ambulating with less frequent rest breaks. Pt. able to ambulate continuously in hallway with rollator for 7 minutes. Pt. had a LOB episode during pivot turn due to L knee buckling with assist from SPT to regain balance. Pt. practiced ambulating with 2 inch plank on floor between feet to increase base of support. Pt. has been consistently ambulating from PT clinic to car in parking lot with CGA to improve functional capacity for aerobic capacity.    Rehab Potential Fair   PT Frequency 2x / week   PT Duration 4 weeks   PT Treatment/Interventions ADLs/Self Care Home Management;DME Instruction;Gait training;Stair training;Functional mobility training;Therapeutic activities;Therapeutic exercise;Patient/family education;Neuromuscular re-education;Balance training;Manual techniques;Energy conservation;Passive range of motion   PT Next Visit Plan Increase B LE muscle strength and overall muscle endurance/ functional mobility. ISSUE NEW HEP   PT Home Exercise Plan See HEP   Consulted and Agree with Plan of Care Patient;Family member/caregiver      Patient will benefit from skilled therapeutic intervention in order to improve the following deficits and impairments:  Abnormal gait, Improper body mechanics, Pain, Postural dysfunction, Decreased coordination, Decreased activity tolerance, Decreased endurance, Decreased range of motion,  Decreased strength, Difficulty walking, Decreased safety awareness, Decreased balance  Visit Diagnosis: Gait difficulty  Muscle weakness (generalized)  Abnormality of gait due to impairment of balance  Chronic bilateral low back pain without sciatica     Problem List There are no active problems to display for this patient.  Pura Spice, PT, DPT # [id number] Willodean Rosenthal, SPT 05/23/2016, 5:08 PM  [facility] [address] [place], [name], [address] Phone: [phone]   Fax:  [phone]  Name: Gwyneth Fernandez MRN: [id number] Date of Birth: 06-Mar-1931

## 2016-05-25 ENCOUNTER — Ambulatory Visit: Payer: Medicare Other | Admitting: Physical Therapy

## 2016-05-25 DIAGNOSIS — R269 Unspecified abnormalities of gait and mobility: Secondary | ICD-10-CM | POA: Diagnosis not present

## 2016-05-25 DIAGNOSIS — M6281 Muscle weakness (generalized): Secondary | ICD-10-CM

## 2016-05-25 DIAGNOSIS — R2689 Other abnormalities of gait and mobility: Secondary | ICD-10-CM

## 2016-05-25 NOTE — Therapy (Signed)
Cindy Tran ([facility] [address] [place], [name], [address] Phone: [phone]   Fax:  [phone]  Physical Therapy Treatment  Patient Details  Name: Cindy Tran MRN: [id number] Date of Birth: January 07, 1931 Referring Provider: Rolene Arbour, FNP  Encounter Date: 05/25/2016      PT End of Session - 05/25/16 1039    Visit Number 21   Number of Visits 23   Date for PT Re-Evaluation 05/31/16   Authorization - Visit Number 21   Authorization - Number of Visits 26   PT Start Time 0939   PT Stop Time 1045   PT Time Calculation (min) 66 min   Equipment Utilized During Treatment Gait belt;Other (comment)   Activity Tolerance Patient tolerated treatment well;Patient limited by fatigue;Other (comment);No increased pain   Behavior During Therapy [facility] for tasks assessed/performed      No past medical history on file.  No past surgical history on file.  There were no vitals filed for this visit.      Subjective Assessment - 05/25/16 0953    Subjective Pt. reports her back is feeling better today. No new complaints    Patient is accompained by: Family member   Limitations Standing;Walking;House hold activities   Patient Stated Goals Increase LE muscle strength to improve balance and gait.     Currently in Pain? No/denies       OBJECTIVE  Neuro: Standing cone taps 4x R/L 3 sets/ Pt. Had tendency to lean trunk posteriorly when her legs started to fatigue and req. Cuing to maintain weight shifting. Narrow bos balance on airex pad, pt. Able to hold for 20 sec. With CGA.  Standing marches on airex pad 20x. Pt. Fatigued after several reps but has improved endurance for aerobic standing activity.  There.Ex.: Sit to stands 10x focusing on LE positioning keeping wide BOS. Ascend/descend stairs 2x B UE assist. Pt. Showed no fatigue with stair climbing today. Standing hip exercises: abduction, extension, and marching in place 10x each R/L. Nustep L6  10 min B UE/LE (no charge).      Pt. will benefit from continued PT to increase BOS, increase endurance during ambulation, and increase LE strength to decrease fall risk and increase independence with ADLs and community ambulation.         PT Long Term Goals - 05/04/16 1622      PT LONG TERM GOAL #1   Title Pt. independent with HEP to increase B LE muscle strength to grossly 4/5 MMT to improve standing tolerance/ walking.    Baseline B LE muscle strength grossly 3+/5 MMT   Time 4   Period Weeks   Status Not Met     PT LONG TERM GOAL #2   Title Pt. able to ascend/ descend 4 steps use of 1 UE and step to pattern safely.     Baseline requires use of B UE assist for safety/ limited hip flexion but able ascend stairs safely   Time 4   Period Weeks   Status Partially Met     PT LONG TERM GOAL #3   Title Pt. able to ambulate 10 minutes with use of RW and consistent step pattern with no LOB to improve mobility.     Baseline limited standing/walking endurance  (improving since initial evaluation)- benefits from rollator.    Time 4   Period Weeks   Status Partially Met     PT LONG TERM GOAL #4   Title Pt. will have no episodes  of R knee buckling consistently for a week to promote safety/ prevent fall risk.     Baseline Less frequent episodes of R knee buckling with standing/walking   Time 4   Period Weeks   Status Achieved     PT LONG TERM GOAL #5   Title Pt. will be able to walk into church from car to church pew with rollator with SBA and no LOB to improve mobility   Baseline Pt. has not been to church since 02/2016   Time 4   Period Weeks   Status New               Plan - 05/25/16 1251    Clinical Impression Statement Pt. presented to PT clinic today with rollator. Pt. able to ascend and descend 4 stairs with multiple trials but still required B hand-hold on the railing to assist. Pt. is consistently able to complete standing strengthening and balance exercises with  varying levels of assist in the parallel bars. Pt. instructed to keep a wider base of support during ambulation and while performing pivot turns from stand to sit.  Pt. is limited by fatigue and requires several rest breaks during tx but works hard. Pt. has been consistently getting out of her house each weekend to attend church service with her family. Pt. needs encouraging to stay active while not in therapy but has shown recent compliance in completing sitting exercises at home with therabands.    Rehab Potential Fair   PT Frequency 2x / week   PT Duration 4 weeks   PT Treatment/Interventions ADLs/Self Care Home Management;DME Instruction;Gait training;Stair training;Functional mobility training;Therapeutic activities;Therapeutic exercise;Patient/family education;Neuromuscular re-education;Balance training;Manual techniques;Energy conservation;Passive range of motion   PT Next Visit Plan Increase B LE muscle strength and overall muscle endurance/ functional mobility. ISSUE NEW HEP   PT Home Exercise Plan See HEP   Consulted and Agree with Plan of Care Patient;Family member/caregiver      Patient will benefit from skilled therapeutic intervention in order to improve the following deficits and impairments:  Abnormal gait, Improper body mechanics, Pain, Postural dysfunction, Decreased coordination, Decreased activity tolerance, Decreased endurance, Decreased range of motion, Decreased strength, Difficulty walking, Decreased safety awareness, Decreased balance  Visit Diagnosis: Gait difficulty  Muscle weakness (generalized)  Abnormality of gait due to impairment of balance     Problem List There are no active problems to display for this patient.  Cindy Tran, PT, DPT # [id number] Cindy Tran, SPT 05/25/2016, 4:55 PM  [facility] [address] [place], [name], [address] Phone: [phone]   Fax:  [phone]  Name: Cindy Tran MRN:  [id number] Date of Birth: [birth year]

## 2016-05-30 ENCOUNTER — Ambulatory Visit: Payer: Medicare Other | Admitting: Physical Therapy

## 2016-05-30 DIAGNOSIS — R269 Unspecified abnormalities of gait and mobility: Secondary | ICD-10-CM

## 2016-05-30 DIAGNOSIS — M545 Low back pain: Secondary | ICD-10-CM

## 2016-05-30 DIAGNOSIS — M6281 Muscle weakness (generalized): Secondary | ICD-10-CM

## 2016-05-30 DIAGNOSIS — R2689 Other abnormalities of gait and mobility: Secondary | ICD-10-CM

## 2016-05-30 DIAGNOSIS — G8929 Other chronic pain: Secondary | ICD-10-CM

## 2016-05-30 NOTE — Therapy (Signed)
[facility] [address] [place], [name], [address] Phone: [phone]   Fax:  [phone]  Physical Therapy Treatment  Patient Details  Name: Cindy Tran MRN: [id number] Date of Birth: [birth year] Referring Provider: Rolene Arbour, FNP  Encounter Date: 05/30/2016      PT End of Session - 05/30/16 1113    Visit Number 22   Number of Visits 26   Date for PT Re-Evaluation 06/27/16   Authorization - Visit Number 22   Authorization - Number of Visits 31   PT Start Time 0940   PT Stop Time 1048   PT Time Calculation (min) 68 min   Equipment Utilized During Treatment Gait belt;Other (comment)   Activity Tolerance Patient tolerated treatment well;Patient limited by fatigue;Other (comment);No increased pain   Behavior During Therapy [facility] for tasks assessed/performed      No past medical history on file.  No past surgical history on file.  There were no vitals filed for this visit.      Subjective Assessment - 05/30/16 1018    Subjective Patient went to church for two services this weekend. Reports continued improvements.  No new complaints.    Patient is accompained by: Family member   Limitations Standing;Walking;House hold activities   Patient Stated Goals Increase LE muscle strength to improve balance and gait.     Currently in Pain? No/denies     TherEx Nustep Lvl 610 minutes Ambulate forwards in // bars 4x with cues for upright posture Marching in place in // bars 2x20 BUE suppport, cues for upright posture Hip extension in // bars 2x10 each leg, forward weight shift Hip abduction 2x10 each leg in parallel bars. Occasional forward compensation of weak musculature.  Ambulating to car. Tactile -min A for guiding rollator downhill.   Gait Ambulating in hallway 4:05 minutes, 3: 47 minutes. Cues for width of base of support and heel strike. Patient occasionally scuffs feet and requires verbal cues for body mechanics to  improve safety while ambulating.  Pt. Maintained good body mechanics throughout and will benefit from continued PT to increase BOS, increase endurance during ambulation, and increase LE strength to decrease fall risk and increase independence with ADLs and community ambulation.        PT Long Term Goals - 05/30/16 1754      PT LONG TERM GOAL #1   Title Pt. independent with HEP to increase B LE muscle strength to grossly 4/5 MMT to improve standing tolerance/ walking.    Baseline B LE muscle strength grossly 4-/5 MMT   Time 4   Period Weeks   Status Partially Met     PT LONG TERM GOAL #2   Title Pt. able to ascend/ descend 4 steps use of 1 UE and step to pattern safely.     Baseline requires use of B UE assist for safety/ limited hip flexion but able ascend stairs safely   Time 4   Period Weeks   Status Partially Met     PT LONG TERM GOAL #3   Title Pt. able to ambulate 10 minutes with use of RW and consistent step pattern with no LOB to improve mobility.     Baseline Patient ambulates ~7 minutes without rest breaks.    Time 4   Period Weeks   Status Partially Met     PT LONG TERM GOAL #4   Title Pt. will have no episodes of R knee buckling consistently for a week to promote  safety/ prevent fall risk.     Baseline Less frequent episodes of R knee buckling with standing/walking   Time 4   Period Weeks   Status Achieved     PT LONG TERM GOAL #5   Title Pt. will be able to walk into church from car to church pew with rollator with SBA and no LOB to improve mobility   Baseline Patient has been to church for 4 weeks in a row.    Time 4   Period Weeks   Status Achieved     Additional Long Term Goals   Additional Long Term Goals Yes     PT LONG TERM GOAL #6   Title Patient will perform 10 sit to stands with UE assistance and no forward LOB to increase mobility in church.    Baseline Patient fatigues and requires rest after 5th sit to stand   Time 4   Period Weeks   Status  New           Plan - 06/24/16 1753    Clinical Impression Statement Patient presented to therapy session today with rollator and continued progression with community ambulation having attended church for two services. Pt. ambulated for a total of 8 minutes in the hallway with one rest break with rollator.  Frequent cues for body mechanics, step length, heel strike, and base of support were required due to scuffing and narrow BOS. Pt. has demonstrated improvements in independent mobility and will benefit from focusing on HEP and reduced physical therapy in clinic to 1x/week in order to maintain mobility and progression towards goals for eventual self-independence. Pt. has been encouraged to stay active while not in therapy and has been attending church services requiring her to ambulate in the community consistently the past four weeks. Pt. will continue to benefit from skilled physical therapy to improve safety with ambulation, decrease risk for falls, increase capacity for functional activities such as ambulation, and gross LE strengthening and balance to increase functional mobility.    Rehab Potential Fair   PT Frequency 1x / week   PT Duration 4 weeks   PT Treatment/Interventions ADLs/Self Care Home Management;DME Instruction;Gait training;Stair training;Functional mobility training;Therapeutic activities;Therapeutic exercise;Patient/family education;Neuromuscular re-education;Balance training;Manual techniques;Energy conservation;Passive range of motion   PT Next Visit Plan new HEP, 2 strength 1 stretch   PT Home Exercise Plan See HEP   Consulted and Agree with Plan of Care Patient;Family member/caregiver      Patient will benefit from skilled therapeutic intervention in order to improve the following deficits and impairments:  Abnormal gait, Improper body mechanics, Pain, Postural dysfunction, Decreased coordination, Decreased activity tolerance, Decreased endurance, Decreased range of  motion, Decreased strength, Difficulty walking, Decreased safety awareness, Decreased balance  Visit Diagnosis: Gait difficulty  Muscle weakness (generalized)  Abnormality of gait due to impairment of balance  Chronic bilateral low back pain without sciatica       G-Codes - 06-24-16 1811    Functional Assessment Tool Used (Outpatient Only) Clinical impression/ gait difficulty/ pain/ muscle weakness   Functional Limitation Mobility: Walking and moving around   Mobility: Walking and Moving Around Current Status ([id number]) At least 20 percent but less than 40 percent impaired, limited or restricted   Mobility: Walking and Moving Around Goal Status [id number]) At least 1 percent but less than 20 percent impaired, limited or restricted      Problem List There are no active problems to display for this patient.  Pura Spice, PT, DPT # [id number] Lenda Kelp  Journey Castonguay, SPT 05/30/2016, 6:13 PM   [facility] [address] [place], [name], [address] Phone: [phone]   Fax:  [phone]  Name: Cindy Tran MRN: [id number] Date of Birth: [birth year]

## 2016-06-01 ENCOUNTER — Encounter: Payer: Medicare Other | Admitting: Physical Therapy

## 2016-06-06 ENCOUNTER — Ambulatory Visit: Payer: Medicare Other | Attending: Gastroenterology | Admitting: Physical Therapy

## 2016-06-06 DIAGNOSIS — G8929 Other chronic pain: Secondary | ICD-10-CM | POA: Diagnosis present

## 2016-06-06 DIAGNOSIS — R2689 Other abnormalities of gait and mobility: Secondary | ICD-10-CM | POA: Diagnosis present

## 2016-06-06 DIAGNOSIS — M545 Low back pain, unspecified: Secondary | ICD-10-CM

## 2016-06-06 DIAGNOSIS — M6281 Muscle weakness (generalized): Secondary | ICD-10-CM

## 2016-06-06 DIAGNOSIS — R269 Unspecified abnormalities of gait and mobility: Secondary | ICD-10-CM | POA: Diagnosis present

## 2016-06-06 NOTE — Patient Instructions (Signed)
New HEP involving  Standing marches at sink : 10x 1x/day Standing hip flexion at sink 10x 1x/day Hamstring curl at sink: 10x 1x/day Hip abduction at sink 10x 1x/day Sit to stand with walker 5x

## 2016-06-06 NOTE — Therapy (Signed)
[facility] [address] [place], [name], [address] Phone: [phone]   Fax:  [phone]  Physical Therapy Treatment  Patient Details  Name: Cindy Tran MRN: [id number] Date of Birth: Sep 11, 1930 Referring Provider: Rolene Arbour, FNP  Encounter Date: 06/06/2016      PT End of Session - 06/06/16 1120    Visit Number 23   Number of Visits 26   Date for PT Re-Evaluation 06/27/16   Authorization - Visit Number 23   Authorization - Number of Visits 31   PT Start Time 1448   PT Stop Time 1119   PT Time Calculation (min) 47 min   Equipment Utilized During Treatment Gait belt   Activity Tolerance Patient tolerated treatment well;No increased pain   Behavior During Therapy [facility] for tasks assessed/performed      No past medical history on file.  No past surgical history on file.  There were no vitals filed for this visit.      Subjective Assessment - 06/06/16 1044    Subjective Pt. went to church this weekend. Went to doctor and found a problem with iron, going in for further testing.    Patient is accompained by: Family member   Limitations Standing;Walking;House hold activities   Patient Stated Goals Increase LE muscle strength to improve balance and gait.     Currently in Pain? No/denies      TherEx Review HEP: // bars: Standing marches 2x10  Hamstring curls 2x10  Hip abductions 1x10  Hip flexion 1x10 Sit to stand with walker 5x with BUE support  Step up and over 6" 6x in // bars with Bue support    Gait Walking around cones in figure 8 sequence to practice turning. Required frequent cues for widening BOS due to tendency to narrow/tightrope walk. 6x.  Cone weaving with rollator. 2 consecutive times to reproduce ambulating in church. Required cues for wider BOS. No LOB, frequent adjustment of walker.   Pt. Response to medical necessity: Patient able to stand for longer durations. Patient will benefit from  continued skilled physical therapy to improve safety with ambulation,decrease fall risk, increase functional capacity for activities such as ambulation, strengthen LE's , and improve balance to increase community mobility.        PT Education - 06/06/16 1408    Education provided Yes   Education Details new HEP   Person(s) Educated Patient;Child(ren)   Methods Explanation;Demonstration;Handout   Comprehension Verbalized understanding;Returned demonstration             PT Long Term Goals - 05/30/16 1754      PT LONG TERM GOAL #1   Title Pt. independent with HEP to increase B LE muscle strength to grossly 4/5 MMT to improve standing tolerance/ walking.    Baseline B LE muscle strength grossly 4-/5 MMT   Time 4   Period Weeks   Status Partially Met     PT LONG TERM GOAL #2   Title Pt. able to ascend/ descend 4 steps use of 1 UE and step to pattern safely.     Baseline requires use of B UE assist for safety/ limited hip flexion but able ascend stairs safely   Time 4   Period Weeks   Status Partially Met     PT LONG TERM GOAL #3   Title Pt. able to ambulate 10 minutes with use of RW and consistent step pattern with no LOB to improve mobility.     Baseline Patient ambulates ~  7 minutes without rest breaks.    Time 4   Period Weeks   Status Partially Met     PT LONG TERM GOAL #4   Title Pt. will have no episodes of R knee buckling consistently for a week to promote safety/ prevent fall risk.     Baseline Less frequent episodes of R knee buckling with standing/walking   Time 4   Period Weeks   Status Achieved     PT LONG TERM GOAL #5   Title Pt. will be able to walk into church from car to church pew with rollator with SBA and no LOB to improve mobility   Baseline Patient has been to church for 4 weeks in a row.    Time 4   Period Weeks   Status Achieved     Additional Long Term Goals   Additional Long Term Goals Yes     PT LONG TERM GOAL #6   Title Patient will  perform 10 sit to stands with UE assistance and no forward LOB to increase mobility in church.    Baseline Patient fatigues and requires rest after 5th sit to stand   Time 4   Period Weeks   Status New            Plan - 06/06/16 1641    Clinical Impression Statement Patient presented to therapy session today with rollator. She recently went to the doctor and was noted to have decreased iron and will be returning for further studies. She started medication on Friday and has noted less fatigue recently. Patient negotiated obstacles with rollator to improve ability to ambulate in the community and church. Turns challenge her and so figure 8's and cone weaving was implemented with improved results and no episodes of tripping or LOB. Pt. was educated on new HEP and demonstrated understanding. Standing tolerance is improved with less need for seated breaks. Patient will benefit from skilled physical therapy to improve safety with ambulation, decrease risk for falls, increase capacity for functional activities, and strengthen LE to increase functional mobility.    Rehab Potential Fair   PT Frequency 1x / week   PT Duration 4 weeks   PT Treatment/Interventions ADLs/Self Care Home Management;DME Instruction;Gait training;Stair training;Functional mobility training;Therapeutic activities;Therapeutic exercise;Patient/family education;Neuromuscular re-education;Balance training;Manual techniques;Energy conservation;Passive range of motion   PT Next Visit Plan cone weaving, outdoor if possible ambulating.    PT Home Exercise Plan See HEP   Consulted and Agree with Plan of Care Patient;Family member/caregiver      Patient will benefit from skilled therapeutic intervention in order to improve the following deficits and impairments:  Abnormal gait, Improper body mechanics, Pain, Postural dysfunction, Decreased coordination, Decreased activity tolerance, Decreased endurance, Decreased range of motion,  Decreased strength, Difficulty walking, Decreased safety awareness, Decreased balance  Visit Diagnosis: Gait difficulty  Muscle weakness (generalized)  Abnormality of gait due to impairment of balance  Chronic bilateral low back pain without sciatica     Problem List There are no active problems to display for this patient.  Pura Spice, PT, DPT # [id number] 06/06/2016, 5:12 PM  [place] [facility] [address] [place], [name], [address] Phone: [phone]   Fax:  [phone]  Name: Batina Dougan MRN: [id number] Date of Birth: Apr 01, 1931

## 2016-06-13 ENCOUNTER — Encounter: Payer: Medicare Other | Admitting: Physical Therapy

## 2016-06-20 ENCOUNTER — Encounter: Payer: Medicare Other | Admitting: Physical Therapy

## 2016-06-27 ENCOUNTER — Encounter: Payer: Medicare Other | Admitting: Physical Therapy

## 2016-08-16 ENCOUNTER — Other Ambulatory Visit
Admission: RE | Admit: 2016-08-16 | Discharge: 2016-08-16 | Disposition: A | Discharge status: Home / Self Care | Payer: Medicare Other | Source: Ambulatory Visit | Attending: Family Medicine | Admitting: Family Medicine

## 2016-08-16 DIAGNOSIS — I2510 Atherosclerotic heart disease of native coronary artery without angina pectoris: Secondary | ICD-10-CM | POA: Insufficient documentation

## 2016-08-16 LAB — BRAIN NATRIURETIC PEPTIDE: B Natriuretic Peptide: 556 pg/mL — ABNORMAL HIGH (ref 0.0–100.0)

## 2016-08-18 ENCOUNTER — Inpatient Hospital Stay: Payer: Medicare Other

## 2016-08-18 ENCOUNTER — Inpatient Hospital Stay
Admission: EM | Admit: 2016-08-18 | Discharge: 2016-08-22 | DRG: 683 | Disposition: A | Discharge status: Other Acute Inpatient Hospital | Payer: Medicare Other | Attending: Internal Medicine | Admitting: Internal Medicine

## 2016-08-18 ENCOUNTER — Encounter: Payer: Self-pay | Admitting: Intensive Care

## 2016-08-18 DIAGNOSIS — Z66 Do not resuscitate: Secondary | ICD-10-CM | POA: Diagnosis present

## 2016-08-18 DIAGNOSIS — R609 Edema, unspecified: Secondary | ICD-10-CM

## 2016-08-18 DIAGNOSIS — K8020 Calculus of gallbladder without cholecystitis without obstruction: Secondary | ICD-10-CM | POA: Diagnosis present

## 2016-08-18 DIAGNOSIS — N390 Urinary tract infection, site not specified: Secondary | ICD-10-CM | POA: Diagnosis present

## 2016-08-18 DIAGNOSIS — T451X5S Adverse effect of antineoplastic and immunosuppressive drugs, sequela: Secondary | ICD-10-CM

## 2016-08-18 DIAGNOSIS — L89512 Pressure ulcer of right ankle, stage 2: Secondary | ICD-10-CM | POA: Diagnosis present

## 2016-08-18 DIAGNOSIS — I482 Chronic atrial fibrillation, unspecified: Secondary | ICD-10-CM

## 2016-08-18 DIAGNOSIS — K922 Gastrointestinal hemorrhage, unspecified: Secondary | ICD-10-CM

## 2016-08-18 DIAGNOSIS — Z79899 Other long term (current) drug therapy: Secondary | ICD-10-CM

## 2016-08-18 DIAGNOSIS — E876 Hypokalemia: Secondary | ICD-10-CM

## 2016-08-18 DIAGNOSIS — Z452 Encounter for adjustment and management of vascular access device: Secondary | ICD-10-CM

## 2016-08-18 DIAGNOSIS — S72401D Unspecified fracture of lower end of right femur, subsequent encounter for closed fracture with routine healing: Secondary | ICD-10-CM | POA: Diagnosis not present

## 2016-08-18 DIAGNOSIS — E871 Hypo-osmolality and hyponatremia: Secondary | ICD-10-CM | POA: Diagnosis present

## 2016-08-18 DIAGNOSIS — X58XXXD Exposure to other specified factors, subsequent encounter: Secondary | ICD-10-CM | POA: Diagnosis present

## 2016-08-18 DIAGNOSIS — I272 Pulmonary hypertension, unspecified: Secondary | ICD-10-CM | POA: Diagnosis present

## 2016-08-18 DIAGNOSIS — I700 Atherosclerosis of aorta: Secondary | ICD-10-CM | POA: Diagnosis present

## 2016-08-18 DIAGNOSIS — I495 Sick sinus syndrome: Secondary | ICD-10-CM | POA: Diagnosis present

## 2016-08-18 DIAGNOSIS — N179 Acute kidney failure, unspecified: Principal | ICD-10-CM

## 2016-08-18 DIAGNOSIS — E440 Moderate protein-calorie malnutrition: Secondary | ICD-10-CM | POA: Diagnosis present

## 2016-08-18 DIAGNOSIS — E860 Dehydration: Secondary | ICD-10-CM

## 2016-08-18 DIAGNOSIS — N19 Unspecified kidney failure: Secondary | ICD-10-CM

## 2016-08-18 DIAGNOSIS — K760 Fatty (change of) liver, not elsewhere classified: Secondary | ICD-10-CM | POA: Diagnosis present

## 2016-08-18 DIAGNOSIS — I10 Essential (primary) hypertension: Secondary | ICD-10-CM | POA: Diagnosis present

## 2016-08-18 DIAGNOSIS — Z950 Presence of cardiac pacemaker: Secondary | ICD-10-CM

## 2016-08-18 DIAGNOSIS — Z8249 Family history of ischemic heart disease and other diseases of the circulatory system: Secondary | ICD-10-CM | POA: Diagnosis not present

## 2016-08-18 DIAGNOSIS — Z7901 Long term (current) use of anticoagulants: Secondary | ICD-10-CM

## 2016-08-18 DIAGNOSIS — T451X5A Adverse effect of antineoplastic and immunosuppressive drugs, initial encounter: Secondary | ICD-10-CM

## 2016-08-18 DIAGNOSIS — R945 Abnormal results of liver function studies: Secondary | ICD-10-CM

## 2016-08-18 DIAGNOSIS — R627 Adult failure to thrive: Secondary | ICD-10-CM

## 2016-08-18 DIAGNOSIS — Z8673 Personal history of transient ischemic attack (TIA), and cerebral infarction without residual deficits: Secondary | ICD-10-CM

## 2016-08-18 DIAGNOSIS — I4891 Unspecified atrial fibrillation: Secondary | ICD-10-CM

## 2016-08-18 DIAGNOSIS — Z6827 Body mass index (BMI) 27.0-27.9, adult: Secondary | ICD-10-CM | POA: Diagnosis not present

## 2016-08-18 DIAGNOSIS — I427 Cardiomyopathy due to drug and external agent: Secondary | ICD-10-CM

## 2016-08-18 DIAGNOSIS — E119 Type 2 diabetes mellitus without complications: Secondary | ICD-10-CM | POA: Diagnosis present

## 2016-08-18 DIAGNOSIS — E861 Hypovolemia: Secondary | ICD-10-CM | POA: Diagnosis present

## 2016-08-18 DIAGNOSIS — R7989 Other specified abnormal findings of blood chemistry: Secondary | ICD-10-CM

## 2016-08-18 DIAGNOSIS — B9620 Unspecified Escherichia coli [E. coli] as the cause of diseases classified elsewhere: Secondary | ICD-10-CM | POA: Diagnosis present

## 2016-08-18 DIAGNOSIS — Z923 Personal history of irradiation: Secondary | ICD-10-CM

## 2016-08-18 DIAGNOSIS — E039 Hypothyroidism, unspecified: Secondary | ICD-10-CM | POA: Diagnosis not present

## 2016-08-18 DIAGNOSIS — Z853 Personal history of malignant neoplasm of breast: Secondary | ICD-10-CM

## 2016-08-18 DIAGNOSIS — L8990 Pressure ulcer of unspecified site, unspecified stage: Secondary | ICD-10-CM | POA: Insufficient documentation

## 2016-08-18 DIAGNOSIS — D649 Anemia, unspecified: Secondary | ICD-10-CM | POA: Diagnosis present

## 2016-08-18 DIAGNOSIS — I429 Cardiomyopathy, unspecified: Secondary | ICD-10-CM | POA: Diagnosis not present

## 2016-08-18 HISTORY — DX: Malignant neoplasm of unspecified site of unspecified female breast: C50.919

## 2016-08-18 HISTORY — DX: Unspecified atrial fibrillation: I48.91

## 2016-08-18 HISTORY — DX: Type 2 diabetes mellitus without complications: E11.9

## 2016-08-18 HISTORY — DX: Essential (primary) hypertension: I10

## 2016-08-18 HISTORY — DX: Malignant (primary) neoplasm, unspecified: C80.1

## 2016-08-18 LAB — CBC WITH DIFFERENTIAL/PLATELET
BASOS PCT: 0 %
Basophils Absolute: 0 10*3/uL (ref 0–0.1)
Eosinophils Absolute: 0 10*3/uL (ref 0–0.7)
Eosinophils Relative: 0 %
HEMATOCRIT: 29.5 % — AB (ref 35.0–47.0)
Hemoglobin: 9.8 g/dL — ABNORMAL LOW (ref 12.0–16.0)
Lymphocytes Relative: 7 %
Lymphs Abs: 0.7 10*3/uL — ABNORMAL LOW (ref 1.0–3.6)
MCH: 27.8 pg (ref 26.0–34.0)
MCHC: 33.1 g/dL (ref 32.0–36.0)
MCV: 83.9 fL (ref 80.0–100.0)
MONOS PCT: 7 %
Monocytes Absolute: 0.7 10*3/uL (ref 0.2–0.9)
Neutro Abs: 8.2 10*3/uL — ABNORMAL HIGH (ref 1.4–6.5)
Neutrophils Relative %: 86 %
Platelets: 171 10*3/uL (ref 150–440)
RBC: 3.51 MIL/uL — ABNORMAL LOW (ref 3.80–5.20)
RDW: 22.1 % — AB (ref 11.5–14.5)
WBC: 9.5 10*3/uL (ref 3.6–11.0)

## 2016-08-18 LAB — URINALYSIS, COMPLETE (UACMP) WITH MICROSCOPIC
BILIRUBIN URINE: NEGATIVE
Glucose, UA: NEGATIVE mg/dL
KETONES UR: NEGATIVE mg/dL
NITRITE: NEGATIVE
Protein, ur: 100 mg/dL — AB
Specific Gravity, Urine: 1.009 (ref 1.005–1.030)
pH: 5 (ref 5.0–8.0)

## 2016-08-18 LAB — TROPONIN I: Troponin I: 0.1 ng/mL (ref <0.03)

## 2016-08-18 LAB — COMPREHENSIVE METABOLIC PANEL
ALBUMIN: 2.8 g/dL — AB (ref 3.5–5.0)
ALT: 434 U/L — ABNORMAL HIGH (ref 14–54)
ANION GAP: 11 (ref 5–15)
AST: 97 U/L — ABNORMAL HIGH (ref 15–41)
Alkaline Phosphatase: 110 U/L (ref 38–126)
BILIRUBIN TOTAL: 1.2 mg/dL (ref 0.3–1.2)
BUN: 101 mg/dL — ABNORMAL HIGH (ref 6–20)
CALCIUM: 8.2 mg/dL — AB (ref 8.9–10.3)
CO2: 30 mmol/L (ref 22–32)
Chloride: 82 mmol/L — ABNORMAL LOW (ref 101–111)
Creatinine, Ser: 4.8 mg/dL — ABNORMAL HIGH (ref 0.44–1.00)
GFR, EST AFRICAN AMERICAN: 9 mL/min — AB (ref >60)
GFR, EST NON AFRICAN AMERICAN: 8 mL/min — AB (ref >60)
Glucose, Bld: 104 mg/dL — ABNORMAL HIGH (ref 65–99)
POTASSIUM: 4.5 mmol/L (ref 3.5–5.1)
Sodium: 123 mmol/L — ABNORMAL LOW (ref 135–145)
TOTAL PROTEIN: 7.6 g/dL (ref 6.5–8.1)

## 2016-08-18 LAB — LIPASE, BLOOD: LIPASE: 146 U/L — AB (ref 11–51)

## 2016-08-18 LAB — MRSA PCR SCREENING: MRSA by PCR: NEGATIVE

## 2016-08-18 MED ORDER — IOPAMIDOL (ISOVUE-300) INJECTION 61%
15.0000 mL | INTRAVENOUS | Status: AC
  Administered 2016-08-18 (×2): 15 mL via ORAL

## 2016-08-18 MED ORDER — SODIUM CHLORIDE 0.9 % IV SOLN
80.0000 mg | Freq: Once | INTRAVENOUS | Status: AC
  Administered 2016-08-18: 14:00:00 80 mg via INTRAVENOUS
  Filled 2016-08-18: qty 80

## 2016-08-18 MED ORDER — DILTIAZEM HCL 100 MG IV SOLR
5.0000 mg/h | Freq: Once | INTRAVENOUS | Status: AC
  Administered 2016-08-18: 5 mg/h via INTRAVENOUS
  Filled 2016-08-18: qty 100

## 2016-08-18 MED ORDER — SODIUM CHLORIDE 0.9 % IV SOLN
8.0000 mg/h | INTRAVENOUS | Status: DC
  Administered 2016-08-18 – 2016-08-19 (×2): 8 mg/h via INTRAVENOUS
  Filled 2016-08-18 (×2): qty 80

## 2016-08-18 MED ORDER — SODIUM CHLORIDE 0.9 % IV SOLN
INTRAVENOUS | Status: DC
  Administered 2016-08-18 – 2016-08-21 (×8): via INTRAVENOUS

## 2016-08-18 MED ORDER — DILTIAZEM HCL ER COATED BEADS 180 MG PO CP24
180.0000 mg | ORAL_CAPSULE | Freq: Every day | ORAL | Status: DC
  Administered 2016-08-18: 180 mg via ORAL
  Filled 2016-08-18: qty 1

## 2016-08-18 MED ORDER — SODIUM CHLORIDE 0.9 % IV BOLUS (SEPSIS)
500.0000 mL | Freq: Once | INTRAVENOUS | Status: AC
  Administered 2016-08-18: 500 mL via INTRAVENOUS

## 2016-08-18 MED ORDER — ATORVASTATIN CALCIUM 20 MG PO TABS
20.0000 mg | ORAL_TABLET | Freq: Every day | ORAL | Status: DC
  Administered 2016-08-18 – 2016-08-20 (×2): 20 mg via ORAL
  Filled 2016-08-18 (×4): qty 1

## 2016-08-18 MED ORDER — DOCUSATE SODIUM 100 MG PO CAPS: 100.0000 mg | ORAL_CAPSULE | Freq: Two times a day (BID) | ORAL | Status: DC | PRN

## 2016-08-18 MED ORDER — ONDANSETRON HCL 4 MG/2ML IJ SOLN
4.0000 mg | Freq: Once | INTRAMUSCULAR | Status: AC
  Administered 2016-08-18: 4 mg via INTRAVENOUS
  Filled 2016-08-18: qty 2

## 2016-08-18 MED ORDER — LEVOTHYROXINE SODIUM 100 MCG PO TABS
100.0000 ug | ORAL_TABLET | Freq: Every day | ORAL | Status: DC
  Administered 2016-08-21 – 2016-08-22 (×2): 100 ug via ORAL
  Filled 2016-08-18 (×4): qty 1

## 2016-08-18 MED ORDER — METOPROLOL SUCCINATE ER 50 MG PO TB24
50.0000 mg | ORAL_TABLET | Freq: Every day | ORAL | Status: DC
  Administered 2016-08-18: 50 mg via ORAL
  Filled 2016-08-18: qty 1

## 2016-08-18 NOTE — ED Notes (Signed)
Attempting to find a second IV access for patient.

## 2016-08-18 NOTE — Progress Notes (Signed)
Family Meeting Note  Advance Directive:yes  Today a meeting took place with the Patient and daughter.   The following clinical team members were present during this meeting:MD  The following were discussed:Patient's diagnosis: A fib< Ac renal failure , Patient's progosis: Unable to determine and Goals for treatment: DNR  Additional follow-up to be provided: nephrology and Gi consult.  Time spent during discussion:20 minutes  Raechell Singleton, Rosalio Macadamia, MD

## 2016-08-18 NOTE — ED Notes (Addendum)
Entered room to assist Crystal and Caryl Pina and the patient with use of bedpan. Pt started spitting up a red substance. Pt stated this was more blood than she was vomiting before. Suction turned on and set up by [employer], Tech. Got pt suctioned. Pt's PICC oozing blood, RN aware. MD notified. Pt unable to provide urine specimen at this time. Pt repositioned and gown changed. Pt sitting up in bed with intermediate suction.

## 2016-08-18 NOTE — ED Provider Notes (Signed)
[facility] Emergency Department Provider Note  ____________________________________________   First MD Initiated Contact with Patient 08/18/16 1206     (approximate)  I have reviewed the triage vital signs and the nursing notes.   HISTORY  Chief Complaint Coagulation Disorder   HPI Cindy Tran is a 83 y.o. female with a recent right distal femur fracture who is presenting with coffee-ground emesis from her rehabilitation facility. She has been having failure to thrive with poor oral intake. Because of this she recently had a midline place the right upper extremity this morning. She was also noted to have renal failure found on labs yesterday with a creatinine above 5. Not complaining of any pain at this time. Denying nausea.   No past medical history on file.  There are no active problems to display for this patient.   No past surgical history on file.  Prior to Admission medications   Not on File    Allergies Patient has no known allergies.  No family history on file.  Social History Social History  Substance Use Topics  . Smoking status: Not on file  . Smokeless tobacco: Not on file  . Alcohol use Not on file    Review of Systems  Constitutional: No fever/chills Eyes: No visual changes. ENT: No sore throat. Cardiovascular: Denies chest pain. Respiratory: Denies shortness of breath. Gastrointestinal: No abdominal pain.   No diarrhea.  No constipation. Genitourinary: Negative for dysuria. Musculoskeletal: Negative for back pain. Skin: Negative for rash. Neurological: Negative for headaches, focal weakness or numbness.   ____________________________________________   PHYSICAL EXAM:  VITAL SIGNS: ED Triage Vitals [08/18/16 1208]  Enc Vitals Group     BP 110/68     Pulse Rate (!) 53     Resp 18     Temp 97.6 F (36.4 C)     Temp Source Axillary     SpO2 100 %     Weight 164 lb (74.4 kg)     Height 5\' 5"  (1.651 m)    Head Circumference      [facility]      Pain Score      Pain Loc      Pain Edu?      Excl. in [place]?     Constitutional: Alert and oriented. No distress. Eyes: Conjunctivae are normal.  Head: Atraumatic. Nose: No congestion/rhinnorhea. Mouth/Throat: Mucous membranes are moist.  Neck: No stridor.   Cardiovascular:  Tachycardic with an irregularly irregular rhythm. Grossly normal heart sounds. Respiratory: Normal respiratory effort.  No retractions. Lungs CTAB. Gastrointestinal: Soft with moderate and diffuse tenderness to palpation. No distention. No CVA tenderness. Musculoskeletal:  Right lower extremity in immobilization from the mid thigh downward. Patient will arrange her right toes. Sensate to light touch. Left lower extremity with mild edema. Full range of motion. Right upper extremity midline in place. No surrounding erythema or induration. Line was just placed this morning. Still with small amount of bloody oozing. However, we're able to draw back smoothly and flush without any issue. Neurologic:  Normal speech and language. No gross focal neurologic deficits are appreciated. Skin:  Skin is warm, dry and intact. No rash noted. Psychiatric: Mood and affect are normal. Speech and behavior are normal.  ____________________________________________   LABS (all labs ordered are listed, but only abnormal results are displayed)  Labs Reviewed - No data to display ____________________________________________  EKG  ED ECG REPORT I, Amore Grater,  Youlanda Roys, the attending physician, personally viewed and interpreted  this ECG.   Date: 08/18/2016  EKG Time: 1220  Rate: 106  Rhythm: atrial fibrillation, rate 106  Axis: normal  Intervals:none  ST&T Change: T wave inversions in II, III, and F aVF as well as V3 through V6. Similar appearance EKG of 06/06/2011.  ____________________________________________  RADIOLOGY  Originally ordered a CAT scan of the abdomen and pelvis. However, the  patient had difficulty laying back in and coffee ground emesis when laid back. ____________________________________________   PROCEDURES  Procedure(s) performed:   Procedures  Critical Care performed:  CRITICAL CARE Performed by: Doran Stabler   Total critical care time: 35 minutes  Critical care time was exclusive of separately billable procedures and treating other patients.  Critical care was necessary to treat or prevent imminent or life-threatening deterioration.  Critical care was time spent personally by me on the following activities: development of treatment plan with patient and/or surrogate as well as nursing, discussions with consultants, evaluation of patient's response to treatment, examination of patient, obtaining history from patient or surrogate, ordering and performing treatments and interventions, ordering and review of laboratory studies, ordering and review of radiographic studies, pulse oximetry and re-evaluation of patient's condition.   ____________________________________________   INITIAL IMPRESSION / ASSESSMENT AND PLAN / ED COURSE  Pertinent labs & imaging results that were available during my care of the patient were reviewed by me and considered in my medical decision making (see chart for details).  ----------------------------------------- 2:38 PM on 08/18/2016 -----------------------------------------  Patient had about 50 cc more of coffee ground emesis while here in the emergency department. Ordered Protonix drip as well as Cardizem. Patient is on Xarelto. Hemoglobin of 9.8. No further episodes of vomiting. Holding [facility] for now. However, patient is high risk and if continues to vomit we will administer this medication. Discussed the case with the patient's family and they are aware of the patient's renal failure as well as her GI bleeding. Patient will be admitted to the hospital. Discussed case with Dr. Allen Norris of gastroenterology who says he  will be able to cover the patient over the next several hours until formal GI consult and comes in at 7 PM. Signed out to Dr. Lollie Marrow.       ____________________________________________   FINAL CLINICAL IMPRESSION(S) / ED DIAGNOSES  Upper GI bleed. Atrial fibrillation with RVR. Acute renal failure.    NEW MEDICATIONS STARTED DURING THIS VISIT:  New Prescriptions   No medications on file     Note:  This document was prepared using Dragon voice recognition software and may include unintentional dictation errors.     Orbie Pyo, MD 08/18/16 [phone]

## 2016-08-18 NOTE — ED Notes (Addendum)
Patients PICC line dressing changed and pressure applied with coban. No bleeding noted at this time. Patients brief changed at this time also

## 2016-08-18 NOTE — ED Triage Notes (Addendum)
Patient arrived from [facility] for excessive bleeding after attempting to insert a PICC line in R upper arm and staff reports vomiting with traces of blood. Patient was having PICC line placed due to dehydration and elevated BUN per facility. Patient currently has brace on R leg due to femur fracture. Currently at [facility] for rehab due to difficulty ambulating.  Patient takes Xarelto daily.HX breast cancer L side

## 2016-08-18 NOTE — H&P (Addendum)
[place] [facility] [place] NAME: Cindy Tran    MR#:  [id number]  DATE OF BIRTH:  Jan 06, 1931  DATE OF ADMISSION:  08/18/2016  PRIMARY CARE PHYSICIAN: [place], [facility]   REQUESTING/REFERRING PHYSICIAN: [place]  CHIEF COMPLAINT:   Chief Complaint  Patient presents with  . Coagulation Disorder    HISTORY OF PRESENT ILLNESS: Cindy Tran  is a 83 y.o. female with a known history of A fib, femur fracture in march 2018- managed conservatively, breast cancer, DM, Htn- noted to have Ac renal filure at [facility], they tried oral hydration, but it did not help much so called PICC line team, while they tied-she had oozing blood around the PICC site and also had nausea and spit up some blood , so suggested to take her to ER. Daughter in room, giving all history, denies major bloody vomits or blood in stool. Hb stable. As per her kidney was fine 2 months ago. We done't have previous labs to compare recently. Hb is stable. Pt was tachycardic on arrival, responded to IV fluids.  PAST MEDICAL HISTORY:   Past Medical History:  Diagnosis Date  . Atrial fibrillation ([place])   . Breast cancer (HCC)    Left side  . Cancer ([place])   . Diabetes mellitus without complication ([place])   . Hypertension     PAST SURGICAL HISTORY: History reviewed. No pertinent surgical history.  SOCIAL HISTORY:  Social History  Substance Use Topics  . Smoking status: Never Smoker  . Smokeless tobacco: Never Used  . Alcohol use No    FAMILY HISTORY:  Family History  Problem Relation Age of Onset  . Hypertension Mother     DRUG ALLERGIES: No Known Allergies  REVIEW OF SYSTEMS:   CONSTITUTIONAL: No fever, fatigue or weakness.  EYES: No blurred or double vision.  EARS, NOSE, AND THROAT: No tinnitus or ear pain.  RESPIRATORY: No cough, shortness of breath, wheezing or hemoptysis.  CARDIOVASCULAR: No chest pain, orthopnea, edema.  GASTROINTESTINAL: No nausea, vomiting,  diarrhea or abdominal pain.  GENITOURINARY: No dysuria, hematuria.  ENDOCRINE: No polyuria, nocturia,  HEMATOLOGY: No anemia, easy bruising or bleeding SKIN: No rash or lesion. MUSCULOSKELETAL: No joint pain or arthritis.   NEUROLOGIC: No tingling, numbness, weakness.  PSYCHIATRY: No anxiety or depression.   MEDICATIONS AT HOME:  Prior to Admission medications   Medication Sig Start Date End Date Taking? Authorizing Provider  atorvastatin (LIPITOR) 20 MG tablet Take 20 mg by mouth daily.   Yes [provider]  Cholecalciferol (D3-1000 PO) Take 1,000 Units by mouth daily.   Yes [provider]  diltiazem (CARDIZEM CD) 180 MG 24 hr capsule Take 180 mg by mouth daily. 05/19/16  Yes [provider]  docusate sodium (COLACE) 100 MG capsule Take 100 mg by mouth 2 (two) times daily.   Yes [provider]  enalapril (VASOTEC) 20 MG tablet Take 20 mg by mouth 2 (two) times daily. 01/07/16 01/06/17 Yes [provider]  esomeprazole (NEXIUM) 40 MG capsule Take 40 mg by mouth daily. 05/12/16  Yes [provider]  ferrous sulfate 325 (65 FE) MG tablet Take 325 mg by mouth 2 (two) times daily. 06/02/16  Yes [provider]  furosemide (LASIX) 40 MG tablet Take 40 mg by mouth daily. 12/09/15 12/08/16 Yes [provider]  levothyroxine (SYNTHROID, LEVOTHROID) 100 MCG tablet Take 100 mcg by mouth daily before breakfast.   Yes [provider]  metoprolol succinate (TOPROL-XL) 50  MG 24 hr tablet Take 50 mg by mouth daily. Take with or immediately following a meal.   Yes [provider]  ondansetron (ZOFRAN) 4 MG tablet Take 4 mg by mouth every 8 (eight) hours as needed for nausea or vomiting.   Yes [provider]  polyethylene glycol (MIRALAX / GLYCOLAX) packet Take 17 g by mouth daily.   Yes [provider]  promethazine (PHENERGAN) 6.25 MG/5ML syrup Take 12.5 mg by mouth every 6 (six) hours as needed for nausea  or vomiting.   Yes [provider]  senna (SENOKOT) 8.6 MG tablet Take 2 tablets by mouth at bedtime.   Yes [provider]  vitamin B-12 (CYANOCOBALAMIN) 1000 MCG tablet Take 1,000 mcg by mouth daily.   Yes [provider]  XARELTO 20 MG TABS tablet Take 20 mg by mouth daily. 05/25/16  Yes [provider]      PHYSICAL EXAMINATION:   VITAL SIGNS: Blood pressure 109/68, pulse 93, temperature 97.6 F (36.4 C), temperature source Axillary, resp. rate 18, height 5\' 5"  (1.651 m), weight 74.4 kg (164 lb), SpO2 96 %.  GENERAL:  83 y.o.-year-old patient lying in the bed with no acute distress.  EYES: Pupils equal, round, reactive to light and accommodation. No scleral icterus. Extraocular muscles intact.  HEENT: Head atraumatic, normocephalic. Oropharynx and nasopharynx clear.  NECK:  Supple, no jugular venous distention. No thyroid enlargement, no tenderness.  LUNGS: Normal breath sounds bilaterally, no wheezing, rales,rhonchi or crepitation. No use of accessory muscles of respiration. On chest wall surgical scar on left side breast area. CARDIOVASCULAR: S1, S2 normal. No murmurs, rubs, or gallops.  ABDOMEN: Soft, nontender, nondistended. Bowel sounds present. No organomegaly or mass.  EXTREMITIES: No pedal edema, cyanosis, or clubbing. Right LL in cast. NEUROLOGIC: Cranial nerves II through XII are intact. Muscle strength 4/5 in upper extremities 1/5 in lower extremities. Sensation intact. Gait not checked.  PSYCHIATRIC: The patient is alert and oriented x 2.  SKIN: No obvious rash, lesion, or ulcer.   LABORATORY PANEL:   CBC  Recent Labs Lab 08/18/16 1232  WBC 9.5  HGB 9.8*  HCT 29.5*  PLT 171  MCV 83.9  MCH 27.8  MCHC 33.1  RDW 22.1*  LYMPHSABS 0.7*  MONOABS 0.7  EOSABS 0.0  BASOSABS 0.0   ------------------------------------------------------------------------------------------------------------------  Chemistries   Recent Labs Lab  08/18/16 1232  NA 123*  K 4.5  CL 82*  CO2 30  GLUCOSE 104*  BUN 101*  CREATININE 4.80*  CALCIUM 8.2*  AST 97*  ALT 434*  ALKPHOS 110  BILITOT 1.2   ------------------------------------------------------------------------------------------------------------------ estimated creatinine clearance is 8.8 mL/min (A) (by C-G formula based on SCr of 4.8 mg/dL (H)). ------------------------------------------------------------------------------------------------------------------ No results for input(s): TSH, T4TOTAL, T3FREE, THYROIDAB in the last 72 hours.  Invalid input(s): FREET3   Coagulation profile No results for input(s): INR, PROTIME in the last 168 hours. ------------------------------------------------------------------------------------------------------------------- No results for input(s): DDIMER in the last 72 hours. -------------------------------------------------------------------------------------------------------------------  Cardiac Enzymes  Recent Labs Lab 08/18/16 1232  TROPONINI 0.10*   ------------------------------------------------------------------------------------------------------------------ Invalid input(s): POCBNP  ---------------------------------------------------------------------------------------------------------------  Urinalysis    Component Value Date/Time   COLORURINE Yellow 06/06/2011 1533   APPEARANCEUR Hazy 06/06/2011 1533   LABSPEC 1.021 06/06/2011 1533   PHURINE 6.0 06/06/2011 1533   GLUCOSEU Negative 06/06/2011 1533   HGBUR 1+ 06/06/2011 1533   BILIRUBINUR Negative 06/06/2011 1533   KETONESUR Negative 06/06/2011 1533   PROTEINUR 100 mg/dL 06/06/2011 1533   NITRITE Negative 06/06/2011 1533  LEUKOCYTESUR Negative 06/06/2011 1533     RADIOLOGY: No results found.  EKG: Orders placed or performed during the hospital encounter of 08/18/16  . ED EKG  . ED EKG  . EKG 12-Lead  . EKG 12-Lead    IMPRESSION AND  PLAN:  * Ac renal failure    No recent previous labs to compare   IV fluids, avoid nephro toxins   Renal US   nephro consult.  * GI bleed   Not major, hb stable.   Hold xarelto.   GI consult.   Protonix IV for now.  * A fib   Cont cardizem and metorpolol oral, rate is better after getting IV fluids.   Hold xarelto for now.   Initially had Rapid rate in ER, given cardizem drip, but were able to taper off in ER, after fluids.  * elevated LFTs and Lipase   Pancreatitis?   With IV fluids, monitor her symptoms and labs.  * femur fracture   Conservative management per ortho for last 8 weeks, cont cast and folow in office in 2-3 weeks.  * blood oozing around the PICC line   [facility] at [facility]   Will do xray chest to check for placement, and able to flush fine as per nurse.   Watch for any extravasation.  All the records are reviewed and case discussed with ED provider. Management plans discussed with the patient, family and they are in agreement.  CODE STATUS: DNR Code Status History    This patient does not have a recorded code status. Please follow your organizational policy for patients in this situation.    Advance Directive Documentation     Most Recent Value  Type of Advance Directive  Out of facility DNR (pink MOST or yellow form)  Pre-existing out of facility DNR order (yellow form or pink MOST form)  Pink MOST form placed in chart (order not valid for inpatient use)  "MOST" Form in Place?  -       TOTAL TIME TAKING CARE OF THIS PATIENT: 50 minutes.    Vaughan Basta M.D on 08/18/2016   Between 7am to 6pm - Pager - [phone]  After 6pm go to [email or web address] - password [email or web address] [email or web address] [facility]  Office  [phone]  CC: Primary care physician; [place], [facility]   Note: This dictation was prepared with Dragon dictation along with smaller phrase technology. Any transcriptional errors that result from this process are  unintentional.

## 2016-08-19 ENCOUNTER — Inpatient Hospital Stay: Payer: Medicare Other

## 2016-08-19 DIAGNOSIS — L8990 Pressure ulcer of unspecified site, unspecified stage: Secondary | ICD-10-CM | POA: Insufficient documentation

## 2016-08-19 LAB — GLUCOSE, CAPILLARY
Glucose-Capillary: 53 mg/dL — ABNORMAL LOW (ref 65–99)
Glucose-Capillary: 84 mg/dL (ref 65–99)
Glucose-Capillary: 68 mg/dL (ref 65–99)

## 2016-08-19 LAB — CBC
HCT: 26.7 % — ABNORMAL LOW (ref 35.0–47.0)
HEMOGLOBIN: 9 g/dL — AB (ref 12.0–16.0)
MCH: 28.2 pg (ref 26.0–34.0)
MCHC: 33.7 g/dL (ref 32.0–36.0)
MCV: 83.7 fL (ref 80.0–100.0)
Platelets: 173 10*3/uL (ref 150–440)
RBC: 3.19 MIL/uL — AB (ref 3.80–5.20)
RDW: 21.8 % — ABNORMAL HIGH (ref 11.5–14.5)
WBC: 9.2 10*3/uL (ref 3.6–11.0)

## 2016-08-19 LAB — HEPATIC FUNCTION PANEL
ALT: 304 U/L — AB (ref 14–54)
AST: 68 U/L — AB (ref 15–41)
Albumin: 2.5 g/dL — ABNORMAL LOW (ref 3.5–5.0)
Alkaline Phosphatase: 96 U/L (ref 38–126)
BILIRUBIN DIRECT: 0.3 mg/dL (ref 0.1–0.5)
BILIRUBIN INDIRECT: 0.9 mg/dL (ref 0.3–0.9)
BILIRUBIN TOTAL: 1.2 mg/dL (ref 0.3–1.2)
Total Protein: 6.8 g/dL (ref 6.5–8.1)

## 2016-08-19 LAB — BASIC METABOLIC PANEL
Anion gap: 11 (ref 5–15)
BUN: 102 mg/dL — ABNORMAL HIGH (ref 6–20)
CO2: 28 mmol/L (ref 22–32)
CREATININE: 4.17 mg/dL — AB (ref 0.44–1.00)
Calcium: 7.8 mg/dL — ABNORMAL LOW (ref 8.9–10.3)
Chloride: 88 mmol/L — ABNORMAL LOW (ref 101–111)
GFR calc non Af Amer: 9 mL/min — ABNORMAL LOW (ref >60)
GFR, EST AFRICAN AMERICAN: 10 mL/min — AB (ref >60)
GLUCOSE: 60 mg/dL — AB (ref 65–99)
Potassium: 4.3 mmol/L (ref 3.5–5.1)
Sodium: 127 mmol/L — ABNORMAL LOW (ref 135–145)

## 2016-08-19 LAB — LIPASE, BLOOD: Lipase: 124 U/L — ABNORMAL HIGH (ref 11–51)

## 2016-08-19 MED ORDER — PANTOPRAZOLE SODIUM 40 MG IV SOLR
40.0000 mg | Freq: Two times a day (BID) | INTRAVENOUS | Status: DC
Start: 2016-08-19 — End: 2016-08-20
  Administered 2016-08-19 (×2): 40 mg via INTRAVENOUS
  Filled 2016-08-19 (×2): qty 40

## 2016-08-19 MED ORDER — ONDANSETRON HCL 4 MG/2ML IJ SOLN
4.0000 mg | Freq: Four times a day (QID) | INTRAMUSCULAR | Status: DC | PRN
  Administered 2016-08-20 – 2016-08-21 (×3): 4 mg via INTRAVENOUS
  Filled 2016-08-19 (×3): qty 2

## 2016-08-19 MED ORDER — ONDANSETRON HCL 4 MG PO TABS: 4.0000 mg | ORAL_TABLET | Freq: Once | ORAL | Status: DC

## 2016-08-19 MED ORDER — SODIUM CHLORIDE 0.9 % IV BOLUS (SEPSIS)
250.0000 mL | Freq: Once | INTRAVENOUS | Status: AC
  Administered 2016-08-19: 250 mL via INTRAVENOUS

## 2016-08-19 MED ORDER — DEXTROSE 50 % IV SOLN
0.5000 | Freq: Once | INTRAVENOUS | Status: AC
  Administered 2016-08-19: 25 mL via INTRAVENOUS

## 2016-08-19 NOTE — Progress Notes (Addendum)
Pt with low BP, 92/32. Symptomatic, dizziness, nausea. Paged MD. Order placed for fluid bolus, NS 0.9% 239mL, recheck BP in 1hr. Order for U/S changed to complete abd.  Update: BG checked, 53. Pt not alert enough to answer questions, refused PO liquids due to nausea and drowsiness. Order for 0.5 ampule D-50.

## 2016-08-19 NOTE — Consult Note (Signed)
[facility] CONSULT NOTE    Date: 08/19/2016                  Patient Name:  Cindy Tran  MRN: [id number]  DOB: 1930/07/08  Age / Sex: 83 y.o., female         PCP: [place], [facility]                 Service Requesting Consult: Hospitalist                 Reason for Consult: Acute renal failure            History of Present Illness: Patient is a 83 y.o. female with a PMHx of Atrial fibrillation, left breast cancer, diabetes mellitus type 2, hypertension, history of femur fracture managed conservatively in March 2018 who was admitted to [facility] on 08/18/2016 for evaluation of acute renal failure.  The patient is a poor historian and history was offered primarily by the patient's son. The patient has been residing at a nursing home after her femur fracture. Over the past 2-3 weeks she's been unable to keep food on her stomach as per her son.  Yesterday the PICC line team was called in to her nursing home to place a PICC so that they could provide the patient hydration. However she apparently spit up some blood per documented history and physical.  We are asked to see the patient for severe acute renal failure. 2 months ago her renal function was normal. Creatinine currently is 4.17 with a BUN of 102.   Medications: Outpatient medications: Prescriptions Prior to Admission  Medication Sig Dispense Refill Last Dose  . atorvastatin (LIPITOR) 20 MG tablet Take 20 mg by mouth daily.   08/17/2016 at 1800  . Cholecalciferol (D3-1000 PO) Take 1,000 Units by mouth daily.   08/18/2016 at 0900  . diltiazem (CARDIZEM CD) 180 MG 24 hr capsule Take 180 mg by mouth daily.   08/18/2016 at 0900  . docusate sodium (COLACE) 100 MG capsule Take 100 mg by mouth 2 (two) times daily.   08/18/2016 at 0900  . enalapril (VASOTEC) 20 MG tablet Take 20 mg by mouth 2 (two) times daily.   08/18/2016 at 0900  . esomeprazole (NEXIUM) 40 MG capsule Take 40 mg by mouth daily.   08/18/2016 at 0630  .  ferrous sulfate 325 (65 FE) MG tablet Take 325 mg by mouth 2 (two) times daily.  3 08/18/2016 at 0900  . furosemide (LASIX) 40 MG tablet Take 40 mg by mouth daily.   08/18/2016 at 0900  . levothyroxine (SYNTHROID, LEVOTHROID) 100 MCG tablet Take 100 mcg by mouth daily before breakfast.   08/18/2016 at 0600  . metoprolol succinate (TOPROL-XL) 50 MG 24 hr tablet Take 50 mg by mouth daily. Take with or immediately following a meal.   08/18/2016 at 0900  . ondansetron (ZOFRAN) 4 MG tablet Take 4 mg by mouth every 8 (eight) hours as needed for nausea or vomiting.   PRN at PRN  . polyethylene glycol (MIRALAX / GLYCOLAX) packet Take 17 g by mouth daily.   08/18/2016 at 0900  . promethazine (PHENERGAN) 6.25 MG/5ML syrup Take 12.5 mg by mouth every 6 (six) hours as needed for nausea or vomiting.   PRN at PRN  . senna (SENOKOT) 8.6 MG tablet Take 2 tablets by mouth at bedtime.   08/17/2016 at 2000  . vitamin B-12 (CYANOCOBALAMIN) 1000 MCG tablet Take 1,000 mcg by mouth daily.  08/18/2016 at 0900  . XARELTO 20 MG TABS tablet Take 20 mg by mouth daily.   08/17/2016 at 1800    Current medications: Current Facility-Administered Medications  Medication Dose Route Frequency Provider Last Rate Last Dose  . 0.9 %  sodium chloride infusion   Intravenous Continuous Vaughan Basta, MD 100 mL/hr at 08/18/16 1752    . atorvastatin (LIPITOR) tablet 20 mg  20 mg Oral Daily Vaughan Basta, MD   20 mg at 08/18/16 1741  . docusate sodium (COLACE) capsule 100 mg  100 mg Oral BID PRN Vaughan Basta, MD      . levothyroxine (SYNTHROID, LEVOTHROID) tablet 100 mcg  100 mcg Oral QAC breakfast Vaughan Basta, MD      . metoprolol succinate (TOPROL-XL) 24 hr tablet 50 mg  50 mg Oral Daily Vaughan Basta, MD   Stopped at 08/19/16 1000  . pantoprazole (PROTONIX) injection 40 mg  40 mg Intravenous Q12H Fritzi Mandes, MD   40 mg at 08/19/16 1108      Allergies: No Known Allergies    Past Medical  History: Past Medical History:  Diagnosis Date  . Atrial fibrillation ([place])   . Breast cancer (H[facility])    Left side  . Cancer ([place])   . Diabetes mellitus without complication ([place])   . Hypertension      Past Surgical History: History reviewed. No pertinent surgical history.   Family History: Family History  Problem Relation Age of Onset  . Hypertension Mother      Social History: Social History   Social History  . Marital status: Divorced    Spouse name: N/A  . Number of children: N/A  . Years of education: N/A   Occupational History  . Not on file.   Social History Main Topics  . Smoking status: Never Smoker  . Smokeless tobacco: Never Used  . Alcohol use No  . Drug use: Unknown  . Sexual activity: Not on file   Other Topics Concern  . Not on file   Social History Narrative  . No narrative on file     Review of Systems: Patient unable to provide ROS, poor historian.   Vital Signs: Blood pressure (!) 93/45, pulse 63, temperature 97.6 F (36.4 C), temperature source Oral, resp. rate 16, height 5\' 5"  (1.651 m), weight 74.4 kg (164 lb), SpO2 100 %.  Weight trends: Filed Weights   08/18/16 1208  Weight: 74.4 kg (164 lb)    Physical Exam: General: No acute distress, resting in bed  Head: Normocephalic, atraumatic.  Eyes: Anicteric, EOMI  Nose: Mucous membranes dry, not inflammed, nonerythematous.  Throat: OM dry, no exudates  Neck: Supple, trachea midline.  Lungs:  Normal respiratory effort. Clear to auscultation BL without crackles or wheezes.  Heart: Irregular, S1S2  Abdomen:  BS normoactive. Soft, Nondistended, non-tender.  No masses or organomegaly.  Extremities: No pretibial edema.  Neurologic: A&O X3, Motor strength is 5/5 in the all 4 extremities  Skin: No visible rashes, scars.    Lab results: Basic Metabolic Panel:  Recent Labs Lab 08/18/16 1232 08/19/16 0536  NA 123* 127*  K 4.5 4.3  CL 82* 88*  CO2 30 28  GLUCOSE 104* 60*   BUN 101* 102*  CREATININE 4.80* 4.17*  CALCIUM 8.2* 7.8*    Liver Function Tests:  Recent Labs Lab 08/18/16 1232 08/19/16 0536  AST 97* 68*  ALT 434* 304*  ALKPHOS 110 96  BILITOT 1.2 1.2  PROT 7.6 6.8  ALBUMIN 2.8* 2.5*  Recent Labs Lab 08/18/16 1232 08/19/16 0536  LIPASE 146* 124*   No results for input(s): AMMONIA in the last 168 hours.  CBC:  Recent Labs Lab 08/18/16 1232 08/19/16 0536  WBC 9.5 9.2  NEUTROABS 8.2*  --   HGB 9.8* 9.0*  HCT 29.5* 26.7*  MCV 83.9 83.7  PLT 171 173    Cardiac Enzymes:  Recent Labs Lab 08/18/16 1232  TROPONINI 0.10*    BNP: Invalid input(s): POCBNP  CBG:  Recent Labs Lab 08/19/16 0804 08/19/16 0954 08/19/16 1222  GLUCAP 46* 84 24    Microbiology: Results for orders placed or performed during the hospital encounter of 08/18/16  MRSA PCR Screening     Status: None   Collection Time: 08/18/16 10:34 PM  Result Value Ref Range Status   MRSA by PCR NEGATIVE NEGATIVE Final    Comment:        The GeneXpert MRSA Assay (FDA approved for NASAL specimens only), is one component of a comprehensive MRSA colonization surveillance program. It is not intended to diagnose MRSA infection nor to guide or monitor treatment for MRSA infections.     Coagulation Studies: No results for input(s): LABPROT, INR in the last 72 hours.  Urinalysis:  Recent Labs  08/18/16 1740  COLORURINE YELLOW*  LABSPEC 1.009  PHURINE 5.0  GLUCOSEU NEGATIVE  HGBUR MODERATE*  BILIRUBINUR NEGATIVE  KETONESUR NEGATIVE  PROTEINUR 100*  NITRITE NEGATIVE  LEUKOCYTESUR LARGE*      Imaging: Dg Chest 1 View  Result Date: 08/18/2016 CLINICAL DATA:  PICC line placement EXAM: CHEST 1 VIEW COMPARISON:  07/04/2011 FINDINGS: Right-sided duo lead pacing device. Surgical clips in the right breast and right axilla. Elevated right diaphragm with small right pleural effusion with atelectasis or scarring at the right base. Left lung is clear.  Stable borderline cardiomegaly with atherosclerosis. Right hilar opacity. Right upper extremity catheter tip positioned over the upper arm. IMPRESSION: 1. Right upper extremity catheter tip appears positioned over the right upper arm. No catheter tubing over the chest. 2. Elevated right diaphragm with small right pleural effusion and right basilar atelectasis, scar or infiltrate. 3. Right hilar soft tissue fullness, unable to exclude adenopathy or mass in the region. CT chest could be obtained for further evaluation as last CT study available was in 2013. Electronically Signed   By: Donavan Foil M.D.   On: 08/18/2016 16:19   US Abdomen Complete  Result Date: 08/19/2016 CLINICAL DATA:  Elevated liver function tests, acute renal failure. EXAM: ABDOMEN ULTRASOUND COMPLETE COMPARISON:  None. FINDINGS: Gallbladder: Stones and sludge are seen in the gallbladder. Largest stone measures approximately 6 mm. No wall thickening. Common bile duct: Diameter: 3 mm, within normal limits. Liver: Diffusely increased in echogenicity. IVC: No abnormality visualized. Pancreas: Somewhat obscured due to bowel gas. Spleen: Size and appearance within normal limits. Right Kidney: Length: 10.0 cm. Parenchymal echogenicity is within normal limits. Anechoic lesion with increased through transmission measures 3.4 x 3.4 x 3.2 cm. No hydronephrosis. Left Kidney: Length: 9.7 cm. An anechoic lesion with increased through transmission in the interpolar region measures 2.9 x 2.8 x 2.8 cm. No hydronephrosis. Abdominal aorta: Suboptimally visualized due to bowel. Maximum measured diameter, 2.5 cm. Other findings: None. IMPRESSION: 1. Examination is limited by patient condition. 2. Hepatic steatosis. 3. Gallstones and sludge. 4. Bilateral renal cysts. Electronically Signed   By: Lorin Picket M.D.   On: 08/19/2016 12:11      Assessment & Plan: Pt is a 83 y.o. female with  a PMHx of Atrial fibrillation, left breast cancer, diabetes mellitus  type 2, hypertension, history of femur fracture managed conservatively in March 2018 who was admitted to [facility] on 08/18/2016 for evaluation of acute renal failure.   1. Severe acute renal failure secondary to prolonged dehydration. 2. Hyponatremia. 3. Anemia not specified. 4. Proteinuria.  Plan:  The patient appears to have severe acute renal failure at this time. This is most likely secondary to prolonged dehydration. Patient has been having episodic vomiting particularly when she eats recently. For now continue IV fluid hydration for at least another 24 hours. I did explain to the patient's son that renal replacement therapy may be necessary if renal function does not improve. He verbalized understanding of this. Serum sodium has, up a bit with IV fluid hydration. We will also check SPEP, UPEP, ANA, ANCA antibodies, GBM antibodies, C3, C4 for serologic workup.  Thanks for consultation.

## 2016-08-19 NOTE — Progress Notes (Addendum)
Pt VS declining, BP 87/39, O2 sat 89% Rapid response called, MD paged.

## 2016-08-19 NOTE — Significant Event (Signed)
Rapid Response Event Note  Overview: Time Called: 0315 Arrival Time: 0838 Event Type: Hypotension, Other (Comment) (generalized lethargy)  Initial Focused Assessment: called for RR, pt lethargic but able to answer questions laying in bed.   Interventions: received D50 for low blood sugar, BP coming up, Dr. Fritzi Mandes in room  Plan of Care (if not transferred): put on tele box (for previous afib issues) continue to check blood sugars and bp's. Pts nurse Magda Paganini will call with any issues.  Event Summary: Name of Physician Notified: Dr Fritzi Mandes at [phone]    at    Outcome: Stayed in room and stabalized  Event End Time: [place]

## 2016-08-19 NOTE — Progress Notes (Signed)
New order placed for zofran. Pt having nausea and vomiting, no blood noted in vomit. Pt was able to eat/drink most of lunch and dinner today. Continues with clear liquids diet, will discuss advancing diet with MD tomorrow.

## 2016-08-19 NOTE — Progress Notes (Signed)
Blood pooling from PICC site.  Line has been pulled out.  Removed and pressure placed to site for 10 min.  Dry clean sterile dressing in place with pressure cobane.

## 2016-08-19 NOTE — Progress Notes (Signed)
Pt VS improving, BP 114/75, Placed on O2 @ 2L, 100%

## 2016-08-19 NOTE — Progress Notes (Signed)
Call received from [employer]. Pt rhythm questionable, pacemaker appears ineffective for rhythm per reporting technician. Will notify MD.

## 2016-08-19 NOTE — Progress Notes (Signed)
Cindy Tran at [place] NAME: Cindy Tran    MR#:  [id number]  DATE OF BIRTH:  November 09, 1930  SUBJECTIVE:    REVIEW OF SYSTEMS:   ROS Tolerating Diet: Tolerating PT:   DRUG ALLERGIES:  No Known Allergies  VITALS:  Blood pressure 103/69, pulse 86, temperature 98.6 F (37 C), temperature source Oral, resp. rate 16, height 5\' 5"  (1.651 m), weight 74.4 kg (164 lb), SpO2 100 %.  PHYSICAL EXAMINATION:   Physical Exam  GENERAL:  83 y.o.-year-old patient lying in the bed with no acute distress.  EYES: Pupils equal, round, reactive to light and accommodation. No scleral icterus. Extraocular muscles intact.  HEENT: Head atraumatic, normocephalic. Oropharynx and nasopharynx clear.  NECK:  Supple, no jugular venous distention. No thyroid enlargement, no tenderness.  LUNGS: Normal breath sounds bilaterally, no wheezing, rales, rhonchi. No use of accessory muscles of respiration.  CARDIOVASCULAR: S1, S2 normal. No murmurs, rubs, or gallops.  ABDOMEN: Soft, nontender, nondistended. Bowel sounds present. No organomegaly or mass.  EXTREMITIES: No cyanosis, clubbing or edema b/l.    NEUROLOGIC: Cranial nerves II through XII are intact. No focal Motor or sensory deficits b/l.   PSYCHIATRIC:  patient is alert and oriented x 3.  SKIN: No obvious rash, lesion, or ulcer.   LABORATORY PANEL:  CBC  Recent Labs Lab 08/19/16 0536  WBC 9.2  HGB 9.0*  HCT 26.7*  PLT 173    Chemistries   Recent Labs Lab 08/19/16 0536  NA 127*  K 4.3  CL 88*  CO2 28  GLUCOSE 60*  BUN 102*  CREATININE 4.17*  CALCIUM 7.8*  AST 68*  ALT 304*  ALKPHOS 96  BILITOT 1.2   Cardiac Enzymes  Recent Labs Lab 08/18/16 1232  TROPONINI 0.10*   RADIOLOGY:  Dg Chest 1 View  Result Date: 08/18/2016 CLINICAL DATA:  PICC line placement EXAM: CHEST 1 VIEW COMPARISON:  07/04/2011 FINDINGS: Right-sided duo lead pacing device. Surgical clips in the right breast and  right axilla. Elevated right diaphragm with small right pleural effusion with atelectasis or scarring at the right base. Left lung is clear. Stable borderline cardiomegaly with atherosclerosis. Right hilar opacity. Right upper extremity catheter tip positioned over the upper arm. IMPRESSION: 1. Right upper extremity catheter tip appears positioned over the right upper arm. No catheter tubing over the chest. 2. Elevated right diaphragm with small right pleural effusion and right basilar atelectasis, scar or infiltrate. 3. Right hilar soft tissue fullness, unable to exclude adenopathy or mass in the region. CT chest could be obtained for further evaluation as last CT study available was in 2013. Electronically Signed   By: Donavan Foil M.D.   On: 08/18/2016 16:19   ASSESSMENT AND PLAN:   Cindy Tran  is a 83 y.o. female with a known history of A fib, femur fracture in march 2018- managed conservatively, breast cancer, DM, Htn- noted to have Ac renal filure at [facility], they tried oral hydration, but it did not help much so called PICC line team, while they tied-she had oozing blood around the PICC site and also had nausea and spit up some blood , so suggested to take her to ER.  * Ac renal failure -Baseline creatinine 06/17/2016 is 0.79 [facility] labs) -  IV fluids, avoid nephro toxins  -Renal US  - nephro consult.  * Patient had an episode where she spit up some blood not sure if this is true GI bleed  Not major, hb stable.   Hold xarelto.   GI consult.   Protonix IV for now. -Patient had upper GI endoscopy and 4-18 at [facility] and showed gastric polyp that was removed. Normal esophagus normal duodenum  * A fib   Cont cardizem and metoprolol oral, rate is better after getting IV fluids.   Hold xarelto for now--- now patient is bedbound and hence will resume Xarelto once seen by GI  * elevated LFTs and Lipase Unclear etiology   With IV fluids, monitor her symptoms and labs.  * femur fracture    Conservative management per ortho for last 8 weeks, cont cast and folow in office in 2-3 weeks. Patient follows with [facility] or full  * blood oozing around the PICC line   Recently placed at [facility]   Will do xray chest to check for placement, and able to flush fine as per nurse.   Watch for any extravasation.  Case discussed with Care Management/Social Worker. Management plans discussed with the patient, family and they are in agreement.  CODE STATUS: DO NOT RESUSCITATE  DVT Prophylaxis: SCD  TOTAL TIME TAKING CARE OF THIS PATIENT: *30* minutes.  >50% time spent on counselling and coordination of care discussed with patient's daughter on the phone  POSSIBLE D/C IN 2-3 DAYS, DEPENDING ON CLINICAL CONDITION.  Note: This dictation was prepared with Dragon dictation along with smaller phrase technology. Any transcriptional errors that result from this process are unintentional.  Cindy Tran M.D on 08/19/2016 at 12:03 PM  Between 7am to 6pm - Pager - [phone]  After 6pm go to [email or web address] - Proofreader  [facility]  Office  [phone]  CC: Primary care physician; , [facility]

## 2016-08-19 NOTE — Progress Notes (Signed)
BG checked due to pt having medical history of DM2, pt has not eaten in 24 hrs per pt's family.

## 2016-08-19 NOTE — Progress Notes (Signed)
Technician here for [facility]. States thast device is working properly, explained that issue with monitor is related to A/V pacer showing as "timing out" because pt is in A-fib, with some intrinsic beats noted." Technician adjusted sensitivity of device, printout of service placed in chart. Will continue to monitor.

## 2016-08-19 NOTE — Consult Note (Signed)
Consultation  Referring Provider:     No ref. provider found Primary Care Physician:  [place], [facility] Primary Gastroenterologist: None         Reason for Consultation:     Coffee ground emesis  Date of Admission:  08/18/2016 Date of Consultation:  08/19/2016         HPI:   Cindy Tran is a 83 y.o. female who has IDA, A fib on xarelto, right periprosthetic distal femur fracture, date of injury 06/14/2016, admitted from nursing home yesterday for dehydration, resulting in AKI. She also had poor po intake, emesis. GI was consulted for possible hematemesis. After reviewing records and talking to her son who is bedside, reported that it was minimal amount and she does not have it anymore. She feels nauseous, poor appetite, fatigue. She had EGD at [facility] on 07/03/16 for IDA, revealed fundic gland polyp, resected, no HPylori detected. She is on oral iron at [facility]. Her Hb has been stable here. Per her son, she was active, ambulatory until she fell and had fracture of her leg.   Past Medical History:  Diagnosis Date  . Atrial fibrillation ([place])   . Breast cancer (HCC)    Left side  . Cancer ([place])   . Diabetes mellitus without complication ([place])   . Hypertension     History reviewed. No pertinent surgical history.  Prior to Admission medications   Medication Sig Start Date End Date Taking? Authorizing Provider  atorvastatin (LIPITOR) 20 MG tablet Take 20 mg by mouth daily.   Yes [provider]  Cholecalciferol (D3-1000 PO) Take 1,000 Units by mouth daily.   Yes [provider]  diltiazem (CARDIZEM CD) 180 MG 24 hr capsule Take 180 mg by mouth daily. 05/19/16  Yes [provider]  docusate sodium (COLACE) 100 MG capsule Take 100 mg by mouth 2 (two) times daily.   Yes [provider]  enalapril (VASOTEC) 20 MG tablet Take 20 mg by mouth 2 (two) times daily. 01/07/16 01/06/17 Yes [provider]  esomeprazole (NEXIUM) 40 MG capsule Take 40 mg by mouth  daily. 05/12/16  Yes [provider]  ferrous sulfate 325 (65 FE) MG tablet Take 325 mg by mouth 2 (two) times daily. 06/02/16  Yes [provider]  furosemide (LASIX) 40 MG tablet Take 40 mg by mouth daily. 12/09/15 12/08/16 Yes [provider]  levothyroxine (SYNTHROID, LEVOTHROID) 100 MCG tablet Take 100 mcg by mouth daily before breakfast.   Yes [provider]  metoprolol succinate (TOPROL-XL) 50 MG 24 hr tablet Take 50 mg by mouth daily. Take with or immediately following a meal.   Yes [provider]  ondansetron (ZOFRAN) 4 MG tablet Take 4 mg by mouth every 8 (eight) hours as needed for nausea or vomiting.   Yes [provider]  polyethylene glycol (MIRALAX / GLYCOLAX) packet Take 17 g by mouth daily.   Yes [provider]  promethazine (PHENERGAN) 6.25 MG/5ML syrup Take 12.5 mg by mouth every 6 (six) hours as needed for nausea or vomiting.   Yes [provider]  senna (SENOKOT) 8.6 MG tablet Take 2 tablets by mouth at bedtime.   Yes [provider]  vitamin B-12 (CYANOCOBALAMIN) 1000 MCG tablet Take 1,000 mcg by mouth daily.   Yes [provider]  XARELTO 20 MG TABS tablet Take 20 mg by mouth daily. 05/25/16  Yes [provider]    Family History  Problem Relation Age of Onset  . Hypertension  Mother      Social History  Substance Use Topics  . Smoking status: Never Smoker  . Smokeless tobacco: Never Used  . Alcohol use No    Allergies as of 08/18/2016  . (No Known Allergies)    Review of Systems:    All systems reviewed and negative except where noted in HPI.   Physical Exam:  Vital signs in last 24 hours: Temp:  [97.6 F (36.4 C)-98.7 F (37.1 C)] 98.6 F (37 C) (05/19 0036) Pulse Rate:  [36-122] 86 (05/19 0956) Resp:  [16-27] 16 (05/19 0036) BP: (102-144)/(45-116) 103/69 (05/19 0956) SpO2:  [84 %-100 %] 100 % (05/19 0956) Weight:  [74.4 kg (164 lb)] 74.4 kg (164 lb) (05/18  1208) Last BM Date: 08/15/16 General: dull, cooperative in NAD Head:  Normocephalic and atraumatic. Eyes:   No icterus.   Conjunctiva pink. PERRLA. Ears:  Normal auditory acuity. Neck:  Supple; no masses or thyroidomegaly Lungs: Respirations even and unlabored. Lungs clear to auscultation bilaterally.   No wheezes, crackles, or rhonchi.  Heart:  Regular rate and rhythm;  Without murmur, clicks, rubs or gallops Abdomen:  Soft, nondistended, nontender. Normal bowel sounds. No appreciable masses or hepatomegaly.  No rebound or guarding.  Rectal:  Not performed.  LAB RESULTS:  Recent Labs  08/18/16 1232 08/19/16 0536  WBC 9.5 9.2  HGB 9.8* 9.0*  HCT 29.5* 26.7*  PLT 171 173   BMET  Recent Labs  08/18/16 1232 08/19/16 0536  NA 123* 127*  K 4.5 4.3  CL 82* 88*  CO2 30 28  GLUCOSE 104* 60*  BUN 101* 102*  CREATININE 4.80* 4.17*  CALCIUM 8.2* 7.8*   LFT  Recent Labs  08/19/16 0536  PROT 6.8  ALBUMIN 2.5*  AST 68*  ALT 304*  ALKPHOS 96  BILITOT 1.2  BILIDIR 0.3  IBILI 0.9   PT/INR No results for input(s): LABPROT, INR in the last 72 hours.  STUDIES: Dg Chest 1 View  Result Date: 08/18/2016 CLINICAL DATA:  PICC line placement EXAM: CHEST 1 VIEW COMPARISON:  07/04/2011 FINDINGS: Right-sided duo lead pacing device. Surgical clips in the right breast and right axilla. Elevated right diaphragm with small right pleural effusion with atelectasis or scarring at the right base. Left lung is clear. Stable borderline cardiomegaly with atherosclerosis. Right hilar opacity. Right upper extremity catheter tip positioned over the upper arm. IMPRESSION: 1. Right upper extremity catheter tip appears positioned over the right upper arm. No catheter tubing over the chest. 2. Elevated right diaphragm with small right pleural effusion and right basilar atelectasis, scar or infiltrate. 3. Right hilar soft tissue fullness, unable to exclude adenopathy or mass in the region. CT chest could  be obtained for further evaluation as last CT study available was in 2013. Electronically Signed   By: Donavan Foil M.D.   On: 08/18/2016 16:19      Impression / Plan:   Cindy Tran is a 83 y.o. y/o female with A Fib on xarelto, DM2, right femur fracture, admitted with AKI. Her nausea and emesis are in the setting of uremia. She does have IDA, possibly bleeding from fundic gland polyp resected. US abdomen normal. There is possibly a component of depression since she sustained fracture causing poor po intake   - No indication for EGD as she is not actively bleeding - Anemia can worsen in the setting of AKI - Continue PPI daily - Continue oral iron - Elevated LFTs likely in the setting of dehydration/transient hypotension  and fatty liver  Thank you for involving me in the care of this patient.      LOS: 1 day   Sherri Sear, MD  08/19/2016, 10:58 AM

## 2016-08-19 NOTE — Clinical Social Work Note (Signed)
The CSW saw during chart review that patient is admitted from [facility]. CSW will assess when able once the facility confirms possibility of return when medically stable.   Santiago Bumpers, MSW, Latanya Presser  [phone]

## 2016-08-19 NOTE — Progress Notes (Signed)
Central tele called with report of 2 sec pause in rhythm. Continues to report pacemaker not pacing according to monitor. Have spoken multiple times with Quita Skye, tech from [place], who states that they will be sending out a representative from [place]. MD is aware. Will continue to monitor.

## 2016-08-19 NOTE — Progress Notes (Signed)
[place] [facility] [place] [place] NAME: Jette Lewan    MR#:  [id number]  DATE OF BIRTH:  Dec 11, 1930  SUBJECTIVE:  Poor historian Came in with weakness and spitting some blood. Found to be dehydrated  REVIEW OF SYSTEMS:   Review of Systems  Constitutional: Negative for chills, fever and weight loss.  HENT: Negative for ear discharge, ear pain and nosebleeds.   Eyes: Negative for blurred vision, pain and discharge.  Respiratory: Negative for sputum production, shortness of breath, wheezing and stridor.   Cardiovascular: Negative for chest pain, palpitations, orthopnea and PND.  Gastrointestinal: Negative for abdominal pain, diarrhea, nausea and vomiting.  Genitourinary: Negative for frequency and urgency.  Musculoskeletal: Negative for back pain and joint pain.  Neurological: Negative for sensory change, speech change, focal weakness and weakness.  Psychiatric/Behavioral: Negative for depression and hallucinations. The patient is not nervous/anxious.    Tolerating Diet:npo Tolerating PT: bed bound due to femor fracture  DRUG ALLERGIES:  No Known Allergies  VITALS:  Blood pressure 103/69, pulse 86, temperature 98.6 F (37 C), temperature source Oral, resp. rate 16, height 5\' 5"  (1.651 m), weight 74.4 kg (164 lb), SpO2 100 %.  PHYSICAL EXAMINATION:   Physical Exam  GENERAL:  83 y.o.-year-old patient lying in the bed with no acute distress. obese EYES: Pupils equal, round, reactive to light and accommodation. No scleral icterus. Extraocular muscles intact.  HEENT: Head atraumatic, normocephalic. Oropharynx and nasopharynx clear.  NECK:  Supple, no jugular venous distention. No thyroid enlargement, no tenderness.  LUNGS: Normal breath sounds bilaterally, no wheezing, rales, rhonchi. No use of accessory muscles of respiration.  CARDIOVASCULAR: S1, S2 normal. No murmurs, rubs, or gallops.  ABDOMEN: Soft, nontender, nondistended. Bowel sounds  present. No organomegaly or mass.  EXTREMITIES: No cyanosis, clubbing or edema b/l.   Right leg immobilizer NEUROLOGIC: Cranial nerves II through XII are intact. No focal Motor or sensory deficits b/l.   PSYCHIATRIC:  patient is alert and oriented x 3.  SKIN: No obvious rash, lesion, or ulcer.   LABORATORY PANEL:  CBC  Recent Labs Lab 08/19/16 0536  WBC 9.2  HGB 9.0*  HCT 26.7*  PLT 173    Chemistries   Recent Labs Lab 08/19/16 0536  NA 127*  K 4.3  CL 88*  CO2 28  GLUCOSE 60*  BUN 102*  CREATININE 4.17*  CALCIUM 7.8*  AST 68*  ALT 304*  ALKPHOS 96  BILITOT 1.2   Cardiac Enzymes  Recent Labs Lab 08/18/16 1232  TROPONINI 0.10*   RADIOLOGY:  Dg Chest 1 View  Result Date: 08/18/2016 CLINICAL DATA:  PICC line placement EXAM: CHEST 1 VIEW COMPARISON:  07/04/2011 FINDINGS: Right-sided duo lead pacing device. Surgical clips in the right breast and right axilla. Elevated right diaphragm with small right pleural effusion with atelectasis or scarring at the right base. Left lung is clear. Stable borderline cardiomegaly with atherosclerosis. Right hilar opacity. Right upper extremity catheter tip positioned over the upper arm. IMPRESSION: 1. Right upper extremity catheter tip appears positioned over the right upper arm. No catheter tubing over the chest. 2. Elevated right diaphragm with small right pleural effusion and right basilar atelectasis, scar or infiltrate. 3. Right hilar soft tissue fullness, unable to exclude adenopathy or mass in the region. CT chest could be obtained for further evaluation as last CT study available was in 2013. Electronically Signed   By: Donavan Foil M.D.   On: 08/18/2016 16:19   ASSESSMENT AND  PLAN:   Leniyah Martell  is a 83 y.o. female with a known history of A fib, femur fracture in march 2018- managed conservatively, breast cancer, DM, Htn- noted to have Ac renal filure at [facility], they tried oral hydration, but it did not help much so called PICC  line team, while they tied-she had oozing blood around the PICC site and also had nausea and spit up some blood , so suggested to take her to ER.  * Ac renal failure -Baseline creatinine 06/17/2016 is 0.79 [facility] labs) -  IV fluids, avoid nephro toxins  -Renal US  - nephro consult.  * Patient had an episode where she spit up some blood not sure if this is true GI bleed    Not major, hb stable.   Hold xarelto.   GI consult.   Protonix IV for now. -Patient had upper GI endoscopy  On 07/03/16 at [facility] and showed gastric polyp that was removed. Normal esophagus normal duodenum  * A fib   Cont cardizem and metoprolol oral, rate is better after getting IV fluids.   Hold xarelto for now--- now patient is bedbound and hence will resume Xarelto once seen by GI  * elevated LFTs and Lipase Unclear etiology   With IV fluids, monitor her symptoms and labs.  * femur fracture in march 2018 (was admitted at [facility])   Conservative management per ortho for last 8 weeks, cont cast and folow in office in 2-3 weeks. Patient follows with [facility] or full  * blood oozing around the PICC line   Recently placed at [facility]   Will do xray chest to check for placement, and able to flush fine as per nurse.   Watch for any extravasation.  Case discussed with Care Management/Social Worker. Management plans discussed with the patient, family and they are in agreement.  CODE STATUS: DO NOT RESUSCITATE  DVT Prophylaxis: SCD  TOTAL TIME TAKING CARE OF THIS PATIENT: *30* minutes.  >50% time spent on counselling and coordination of care discussed with patient's daughter on the phone  POSSIBLE D/C IN 2-3 DAYS, DEPENDING ON CLINICAL CONDITION.  Note: This dictation was prepared with Dragon dictation along with smaller phrase technology. Any transcriptional errors that result from this process are unintentional.  Nivedita Mirabella M.D on 08/19/2016 at 12:11 PM  Between 7am to 6pm - Pager - [phone]  After 6pm go to  [email or web address] - Proofreader  [facility]  Office  [phone]  CC: Primary care physician; [place], [facility]

## 2016-08-20 LAB — BASIC METABOLIC PANEL
ANION GAP: 10 (ref 5–15)
BUN: 90 mg/dL — AB (ref 6–20)
CALCIUM: 7.8 mg/dL — AB (ref 8.9–10.3)
CO2: 24 mmol/L (ref 22–32)
Chloride: 95 mmol/L — ABNORMAL LOW (ref 101–111)
Creatinine, Ser: 3.25 mg/dL — ABNORMAL HIGH (ref 0.44–1.00)
GFR calc Af Amer: 14 mL/min — ABNORMAL LOW (ref >60)
GFR calc non Af Amer: 12 mL/min — ABNORMAL LOW (ref >60)
GLUCOSE: 119 mg/dL — AB (ref 65–99)
Potassium: 4.7 mmol/L (ref 3.5–5.1)
Sodium: 129 mmol/L — ABNORMAL LOW (ref 135–145)

## 2016-08-20 LAB — PROTEIN / CREATININE RATIO, URINE
Creatinine, Urine: 59 mg/dL
PROTEIN CREATININE RATIO: 1.08 mg/mg{creat} — AB (ref 0.00–0.15)
TOTAL PROTEIN, URINE: 64 mg/dL

## 2016-08-20 MED ORDER — DILTIAZEM HCL 60 MG PO TABS
30.0000 mg | ORAL_TABLET | Freq: Four times a day (QID) | ORAL | Status: DC
  Administered 2016-08-20: 30 mg via ORAL
  Filled 2016-08-20: qty 1
  Filled 2016-08-20: qty 2

## 2016-08-20 MED ORDER — SODIUM CHLORIDE 0.9 % IV SOLN
510.0000 mg | Freq: Once | INTRAVENOUS | Status: AC
  Administered 2016-08-20: 510 mg via INTRAVENOUS
  Filled 2016-08-20: qty 17

## 2016-08-20 MED ORDER — NEPRO/CARBSTEADY PO LIQD
237.0000 mL | Freq: Two times a day (BID) | ORAL | Status: DC
  Administered 2016-08-20: 237 mL via ORAL

## 2016-08-20 MED ORDER — PANTOPRAZOLE SODIUM 40 MG PO TBEC
40.0000 mg | DELAYED_RELEASE_TABLET | Freq: Every day | ORAL | Status: DC
  Administered 2016-08-20 – 2016-08-22 (×3): 40 mg via ORAL
  Filled 2016-08-20 (×3): qty 1

## 2016-08-20 MED ORDER — ACETAMINOPHEN 325 MG PO TABS
650.0000 mg | ORAL_TABLET | Freq: Four times a day (QID) | ORAL | Status: DC | PRN
  Administered 2016-08-20: 650 mg via ORAL
  Filled 2016-08-20: qty 2

## 2016-08-20 NOTE — Progress Notes (Signed)
Subjective: Is more alert and awake today. Cr improving, urine bag with turbid, white colored urine No further episodes of hematemesis, Hb stable   Objective: Vital signs in last 24 hours: Vitals:   08/19/16 1333 08/19/16 1658 08/19/16 2237 08/20/16 0751  BP: (!) 93/45  (!) 105/44 (!) 94/53  Pulse: 63  67 92  Resp:   18 18  Temp: 97.6 F (36.4 C)  97.1 F (36.2 C) 98.7 F (37.1 C)  TempSrc: Oral  Axillary   SpO2: 100% 100% 100% 100%  Weight:      Height:       Weight change:   Intake/Output Summary (Last 24 hours) at 08/20/16 1333 Last data filed at 08/20/16 0800  Gross per 24 hour  Intake             4600 ml  Output                0 ml  Net             4600 ml     Lab Results: @LABTEST2 @ Micro Results: Recent Results (from the past 240 hour(s))  MRSA PCR Screening     Status: None   Collection Time: 08/18/16 10:34 PM  Result Value Ref Range Status   MRSA by PCR NEGATIVE NEGATIVE Final    Comment:        The GeneXpert MRSA Assay (FDA approved for NASAL specimens only), is one component of a comprehensive MRSA colonization surveillance program. It is not intended to diagnose MRSA infection nor to guide or monitor treatment for MRSA infections.    Studies/Results: Dg Chest 1 View  Result Date: 08/18/2016 CLINICAL DATA:  PICC line placement EXAM: CHEST 1 VIEW COMPARISON:  07/04/2011 FINDINGS: Right-sided duo lead pacing device. Surgical clips in the right breast and right axilla. Elevated right diaphragm with small right pleural effusion with atelectasis or scarring at the right base. Left lung is clear. Stable borderline cardiomegaly with atherosclerosis. Right hilar opacity. Right upper extremity catheter tip positioned over the upper arm. IMPRESSION: 1. Right upper extremity catheter tip appears positioned over the right upper arm. No catheter tubing over the chest. 2. Elevated right diaphragm with small right pleural effusion and right basilar atelectasis,  scar or infiltrate. 3. Right hilar soft tissue fullness, unable to exclude adenopathy or mass in the region. CT chest could be obtained for further evaluation as last CT study available was in 2013. Electronically Signed   By: Donavan Foil M.D.   On: 08/18/2016 16:19   US Abdomen Complete  Result Date: 08/19/2016 CLINICAL DATA:  Elevated liver function tests, acute renal failure. EXAM: ABDOMEN ULTRASOUND COMPLETE COMPARISON:  None. FINDINGS: Gallbladder: Stones and sludge are seen in the gallbladder. Largest stone measures approximately 6 mm. No wall thickening. Common bile duct: Diameter: 3 mm, within normal limits. Liver: Diffusely increased in echogenicity. IVC: No abnormality visualized. Pancreas: Somewhat obscured due to bowel gas. Spleen: Size and appearance within normal limits. Right Kidney: Length: 10.0 cm. Parenchymal echogenicity is within normal limits. Anechoic lesion with increased through transmission measures 3.4 x 3.4 x 3.2 cm. No hydronephrosis. Left Kidney: Length: 9.7 cm. An anechoic lesion with increased through transmission in the interpolar region measures 2.9 x 2.8 x 2.8 cm. No hydronephrosis. Abdominal aorta: Suboptimally visualized due to bowel. Maximum measured diameter, 2.5 cm. Other findings: None. IMPRESSION: 1. Examination is limited by patient condition. 2. Hepatic steatosis. 3. Gallstones and sludge. 4. Bilateral renal cysts. Electronically Signed  By: Lorin Picket M.D.   On: 08/19/2016 12:11   Medications: I have reviewed the patient's current medications. Scheduled Meds: . atorvastatin  20 mg Oral Daily  . levothyroxine  100 mcg Oral QAC breakfast  . ondansetron  4 mg Oral Once  . pantoprazole  40 mg Oral Daily   Continuous Infusions: . sodium chloride 100 mL/hr at 08/20/16 0124   PRN Meds:.acetaminophen, docusate sodium, ondansetron (ZOFRAN) IV   Assessment: Principal Problem:   Acute renal failure (ARF) (HCC) Active Problems:   GI bleed   Pressure  injury of skin  Cindy Tran is a 83 y.o. y/o female with A Fib on xarelto, DM2, right femur fracture, admitted with AKI. Her nausea and emesis are in the setting of uremia. She does have IDA, possibly bleeding from fundic gland polyp resected. US abdomen normal. There is possibly a component of depression since she sustained fracture causing poor po intake   Plan:  - No indication for EGD as she is not actively bleeding - Anemia can worsen in the setting of AKI - Continue PPI daily - Continue oral iron - Feraheme x 1 today - Elevated LFTs likely in the setting of dehydration/transient hypotension and fatty liver  Call us back with questions   LOS: 2 days   Rohini Vanga 08/20/2016, 1:33 PM

## 2016-08-20 NOTE — Progress Notes (Signed)
[facility]  ROUNDING NOTE   Subjective:  Patient seen at bedside. New renal function testing pending this a.m. Patient states that she is producing urine however this isn't documented.   Objective:  Vital signs in last 24 hours:  Temp:  [97.1 F (36.2 C)-98.7 F (37.1 C)] 98.7 F (37.1 C) (05/20 0751) Pulse Rate:  [63-92] 92 (05/20 0751) Resp:  [18] 18 (05/20 0751) BP: (93-105)/(44-53) 94/53 (05/20 0751) SpO2:  [100 %] 100 % (05/20 0751) FiO2 (%):  [28 %] 28 % (05/19 1658)  Weight change:  Filed Weights   08/18/16 1208  Weight: 74.4 kg (164 lb)    Intake/Output: I/O last 3 completed shifts: In: 5663.3 [P.O.:840; I.V.:4323.3; IV Piggyback:500] Out: 0    Intake/Output this shift:  No intake/output data recorded.  Physical Exam: General: No acute distress  Head: Normocephalic, atraumatic. Moist oral mucosal membranes  Eyes: Anicteric  Neck: Supple, trachea midline  Lungs:  Clear to auscultation, normal effort  Heart: S1S2 no rubs  Abdomen:  Soft, nontender, bowel sounds present  Extremities: Trace peripheral edema.  Neurologic: Awake, alert, following commands  Skin: No lesions       Basic Metabolic Panel:  Recent Labs Lab 08/18/16 1232 08/19/16 0536  NA 123* 127*  K 4.5 4.3  CL 82* 88*  CO2 30 28  GLUCOSE 104* 60*  BUN 101* 102*  CREATININE 4.80* 4.17*  CALCIUM 8.2* 7.8*    Liver Function Tests:  Recent Labs Lab 08/18/16 1232 08/19/16 0536  AST 97* 68*  ALT 434* 304*  ALKPHOS 110 96  BILITOT 1.2 1.2  PROT 7.6 6.8  ALBUMIN 2.8* 2.5*    Recent Labs Lab 08/18/16 1232 08/19/16 0536  LIPASE 146* 124*   No results for input(s): AMMONIA in the last 168 hours.  CBC:  Recent Labs Lab 08/18/16 1232 08/19/16 0536  WBC 9.5 9.2  NEUTROABS 8.2*  --   HGB 9.8* 9.0*  HCT 29.5* 26.7*  MCV 83.9 83.7  PLT 171 173    Cardiac Enzymes:  Recent Labs Lab 08/18/16 1232  TROPONINI 0.10*    BNP: Invalid input(s):  POCBNP  CBG:  Recent Labs Lab 08/19/16 0804 08/19/16 0954 08/19/16 1222  GLUCAP 8* 84 41    Microbiology: Results for orders placed or performed during the hospital encounter of 08/18/16  MRSA PCR Screening     Status: None   Collection Time: 08/18/16 10:34 PM  Result Value Ref Range Status   MRSA by PCR NEGATIVE NEGATIVE Final    Comment:        The GeneXpert MRSA Assay (FDA approved for NASAL specimens only), is one component of a comprehensive MRSA colonization surveillance program. It is not intended to diagnose MRSA infection nor to guide or monitor treatment for MRSA infections.     Coagulation Studies: No results for input(s): LABPROT, INR in the last 72 hours.  Urinalysis:  Recent Labs  08/18/16 1740  COLORURINE YELLOW*  LABSPEC 1.009  PHURINE 5.0  GLUCOSEU NEGATIVE  HGBUR MODERATE*  BILIRUBINUR NEGATIVE  KETONESUR NEGATIVE  PROTEINUR 100*  NITRITE NEGATIVE  LEUKOCYTESUR LARGE*      Imaging: Dg Chest 1 View  Result Date: 08/18/2016 CLINICAL DATA:  PICC line placement EXAM: CHEST 1 VIEW COMPARISON:  07/04/2011 FINDINGS: Right-sided duo lead pacing device. Surgical clips in the right breast and right axilla. Elevated right diaphragm with small right pleural effusion with atelectasis or scarring at the right base. Left lung is clear. Stable borderline cardiomegaly with  atherosclerosis. Right hilar opacity. Right upper extremity catheter tip positioned over the upper arm. IMPRESSION: 1. Right upper extremity catheter tip appears positioned over the right upper arm. No catheter tubing over the chest. 2. Elevated right diaphragm with small right pleural effusion and right basilar atelectasis, scar or infiltrate. 3. Right hilar soft tissue fullness, unable to exclude adenopathy or mass in the region. CT chest could be obtained for further evaluation as last CT study available was in 2013. Electronically Signed   By: Donavan Foil M.D.   On: 08/18/2016 16:19    US Abdomen Complete  Result Date: 08/19/2016 CLINICAL DATA:  Elevated liver function tests, acute renal failure. EXAM: ABDOMEN ULTRASOUND COMPLETE COMPARISON:  None. FINDINGS: Gallbladder: Stones and sludge are seen in the gallbladder. Largest stone measures approximately 6 mm. No wall thickening. Common bile duct: Diameter: 3 mm, within normal limits. Liver: Diffusely increased in echogenicity. IVC: No abnormality visualized. Pancreas: Somewhat obscured due to bowel gas. Spleen: Size and appearance within normal limits. Right Kidney: Length: 10.0 cm. Parenchymal echogenicity is within normal limits. Anechoic lesion with increased through transmission measures 3.4 x 3.4 x 3.2 cm. No hydronephrosis. Left Kidney: Length: 9.7 cm. An anechoic lesion with increased through transmission in the interpolar region measures 2.9 x 2.8 x 2.8 cm. No hydronephrosis. Abdominal aorta: Suboptimally visualized due to bowel. Maximum measured diameter, 2.5 cm. Other findings: None. IMPRESSION: 1. Examination is limited by patient condition. 2. Hepatic steatosis. 3. Gallstones and sludge. 4. Bilateral renal cysts. Electronically Signed   By: Lorin Picket M.D.   On: 08/19/2016 12:11     Medications:   . sodium chloride 100 mL/hr at 08/20/16 0124   . atorvastatin  20 mg Oral Daily  . levothyroxine  100 mcg Oral QAC breakfast  . ondansetron  4 mg Oral Once  . pantoprazole  40 mg Oral Daily   docusate sodium, ondansetron (ZOFRAN) IV  Assessment/ Plan:  83 y.o. female with a PMHx of Atrial fibrillation, left breast cancer, diabetes mellitus type 2, hypertension, history of femur fracture managed conservatively in March 2018 who was admitted to [facility] on 08/18/2016 for evaluation of acute renal failure.   1. Severe acute renal failure secondary to prolonged dehydration. 2. Hyponatremia. 3. Anemia not specified. 4. Proteinuria.  Plan:  We are seeing the patient for severe acute renal failure. No new renal  function testing available at the moment. Labs are currently pending. Patient states that she is producing some urine. We will await further laboratory testing before making further decision. For now continue IV fluid hydration with 0.9 normal saline at 100 cc per hour. If renal function does not improve significantly by tomorrow recommend consideration of short-term hemodialysis. This was discussed with the patient's son this a.m. Also continue to monitor hemoglobin as hemoglobin has trended down to 9.0. We will continue to monitor along.   LOS: 2 Jenicka Coxe 5/20/201810:06 AM

## 2016-08-20 NOTE — Clinical Social Work Note (Signed)
Clinical Social Work Assessment  Patient Details  Name: Cindy Tran MRN: [id number] Date of Birth: February 02, 1931  Date of referral:  08/20/16               Reason for consult:  Facility Placement                Permission sought to share information with:  Facility Art therapist granted to share information::  Yes, Verbal Permission Granted  Name::        Agency::     Relationship::     Contact Information:     Housing/Transportation Living arrangements for the past 2 months:  [place] of Information:  Facility Patient Interpreter Needed:  None Criminal Activity/Legal Involvement Pertinent to Current Situation/Hospitalization:  No - Comment as needed Significant Relationships:  Adult Children, Warehouse manager, [employer] Lives with:  Facility Resident Do you feel safe going back to the place where you live?  Yes Need for family participation in patient care:  Yes (Comment)  Care giving concerns:  Patient admitted from [facility]   Social Worker assessment / plan:  The CSW met with the patient at bedside. She was alert but a poor historian. The CSW attempted to contact family with no answer. The CSW contacted the facility.  According to [facility], the patient can return when stable and is a STR resident stemming from a femur fracture. The patient was admitted due to excessive bleeding after a PICC line insertion with accompanying vomiting with traces of blood. The PICC was being inserted due to dehydration and elevated BUN.  The patient will most likely discharge back to [facility] on Monday, 5/21 pending medical clearance.  Employment status:  Retired Forensic scientist:  Medicare PT Recommendations:    Information / Referral to community resources:     Patient/Family's Response to care:  The patient is a poor historian. The family was not able to be reached.  Patient/Family's Understanding of and Emotional Response to Diagnosis, Current  Treatment, and Prognosis:  The patient is a poor historian. The family was not able to be reached.  Emotional Assessment Appearance:  Appears stated age Attitude/Demeanor/Rapport:  Lethargic Affect (typically observed):  Appropriate, Pleasant Orientation:  Oriented to Self, Oriented to Place Alcohol / Substance use:  Never Used Psych involvement (Current and /or in the community):  No (Comment)  Discharge Needs  Concerns to be addressed:  Care Coordination, Discharge Planning Concerns Readmission within the last 30 days:  No Current discharge risk:  Chronically ill Barriers to Discharge:  Continued Medical Work up   [employer], LCSW 08/20/2016, 11:08 AM

## 2016-08-20 NOTE — NC FL2 (Signed)
  [place] LEVEL OF CARE SCREENING TOOL     IDENTIFICATION  Patient Name: Cindy Tran Birthdate: 04/13/1930 Sex: female Admission Date (Current Location): 08/18/2016  [place] and [place] Number:  Engineering geologist and Address:  [facility], [address] [place], [place] [address]      Provider Number: [id number]  Attending Physician Name and Address:  Fritzi Mandes, MD  Relative Name and Phone Number:       Current Level of Care: Hospital Recommended Level of Care: [place] Prior Approval Number:    Date Approved/Denied:   PASRR Number: [id number]  Discharge Plan: SNF    Current Diagnoses: Patient Active Problem List   Diagnosis Date Noted  . Pressure injury of skin 08/19/2016  . Acute renal failure (ARF) ([place]) 08/18/2016  . GI bleed 08/18/2016    Orientation RESPIRATION BLADDER Height & Weight     Self  Normal Continent Weight: 164 lb (74.4 kg) Height:  5\' 5"  (165.1 cm)  BEHAVIORAL SYMPTOMS/MOOD NEUROLOGICAL BOWEL NUTRITION STATUS      Continent    AMBULATORY STATUS COMMUNICATION OF NEEDS Skin   Extensive Assist Verbally Normal                       Personal Care Assistance Level of Assistance  Bathing, Feeding, Dressing Bathing Assistance: Limited assistance Feeding assistance: Independent Dressing Assistance: Limited assistance     Functional Limitations Info             SPECIAL CARE FACTORS FREQUENCY  PT (By licensed PT)     PT Frequency: Resume care up to 5X per day 5 days per week              Contractures      Additional Factors Info  Code Status Code Status Info: DNR             Current Medications (08/20/2016):  This is the current hospital active medication list Current Facility-Administered Medications  Medication Dose Route Frequency Provider Last Rate Last Dose  . 0.9 %  sodium chloride infusion   Intravenous Continuous Vaughan Basta, MD 100 mL/hr  at 08/20/16 0124    . atorvastatin (LIPITOR) tablet 20 mg  20 mg Oral Daily Vaughan Basta, MD   20 mg at 08/18/16 1741  . docusate sodium (COLACE) capsule 100 mg  100 mg Oral BID PRN Vaughan Basta, MD      . levothyroxine (SYNTHROID, LEVOTHROID) tablet 100 mcg  100 mcg Oral QAC breakfast Vaughan Basta, MD      . ondansetron [facility]) injection 4 mg  4 mg Intravenous Q6H PRN Dustin Flock, MD      . ondansetron (ZOFRAN) tablet 4 mg  4 mg Oral Once Dustin Flock, MD      . pantoprazole (PROTONIX) EC tablet 40 mg  40 mg Oral Daily Fritzi Mandes, MD         Discharge Medications: Please see discharge summary for a list of discharge medications.  Relevant Imaging Results:  Relevant Lab Results:   Additional Information SS# [id number]  [name], LCSW

## 2016-08-20 NOTE — Progress Notes (Addendum)
[facility] NAME: Cindy Tran    MR#:  [id number]  DATE OF BIRTH:  [birth year]  SUBJECTIVE:  Poor historian Came in with weakness and spitting some blood. Found to be dehydrated  REVIEW OF SYSTEMS:   Review of Systems  Constitutional: Negative for chills, fever and weight loss.  HENT: Negative for ear discharge, ear pain and nosebleeds.   Eyes: Negative for blurred vision, pain and discharge.  Respiratory: Negative for sputum production, shortness of breath, wheezing and stridor.   Cardiovascular: Negative for chest pain, palpitations, orthopnea and PND.  Gastrointestinal: Negative for abdominal pain, diarrhea, nausea and vomiting.  Genitourinary: Negative for frequency and urgency.  Musculoskeletal: Negative for back pain and joint pain.  Neurological: Positive for weakness. Negative for sensory change, speech change and focal weakness.  Psychiatric/Behavioral: Negative for depression and hallucinations. The patient is not nervous/anxious.    Tolerating Diet:CLD Tolerating PT: bed bound due to femur fracture  DRUG ALLERGIES:  No Known Allergies  VITALS:  Blood pressure (!) 94/53, pulse 92, temperature 98.7 F (37.1 C), resp. rate 18, height 5\' 5"  (1.651 m), weight 74.4 kg (164 lb), SpO2 100 %.  PHYSICAL EXAMINATION:   Physical Exam  GENERAL:  83 y.o.-year-old patient lying in the bed with no acute distress. obese EYES: Pupils equal, round, reactive to light and accommodation. No scleral icterus. Extraocular muscles intact.  HEENT: Head atraumatic, normocephalic. Oropharynx and nasopharynx clear.  NECK:  Supple, no jugular venous distention. No thyroid enlargement, no tenderness.  LUNGS: Normal breath sounds bilaterally, no wheezing, rales, rhonchi. No use of accessory muscles of respiration.  CARDIOVASCULAR: S1, S2 normal. No murmurs, rubs, or gallops.  ABDOMEN: Soft, nontender, nondistended. Bowel sounds present.  No organomegaly or mass.  EXTREMITIES: No cyanosis, clubbing or edema b/l.   Right leg immobilizer NEUROLOGIC: Cranial nerves II through XII are intact. No focal Motor or sensory deficits b/l.   PSYCHIATRIC:  patient is alert and oriented x 3.  SKIN: No obvious rash, lesion, or ulcer.   LABORATORY PANEL:  CBC  Recent Labs Lab 08/19/16 0536  WBC 9.2  HGB 9.0*  HCT 26.7*  PLT 173    Chemistries   Recent Labs Lab 08/19/16 0536 08/20/16 0953  NA 127* 129*  K 4.3 4.7  CL 88* 95*  CO2 28 24  GLUCOSE 60* 119*  BUN 102* 90*  CREATININE 4.17* 3.25*  CALCIUM 7.8* 7.8*  AST 68*  --   ALT 304*  --   ALKPHOS 96  --   BILITOT 1.2  --    Cardiac Enzymes  Recent Labs Lab 08/18/16 1232  TROPONINI 0.10*   RADIOLOGY:  Dg Chest 1 View  Result Date: 08/18/2016 CLINICAL DATA:  PICC line placement EXAM: CHEST 1 VIEW COMPARISON:  07/04/2011 FINDINGS: Right-sided duo lead pacing device. Surgical clips in the right breast and right axilla. Elevated right diaphragm with small right pleural effusion with atelectasis or scarring at the right base. Left lung is clear. Stable borderline cardiomegaly with atherosclerosis. Right hilar opacity. Right upper extremity catheter tip positioned over the upper arm. IMPRESSION: 1. Right upper extremity catheter tip appears positioned over the right upper arm. No catheter tubing over the chest. 2. Elevated right diaphragm with small right pleural effusion and right basilar atelectasis, scar or infiltrate. 3. Right hilar soft tissue fullness, unable to exclude adenopathy or mass in the region. CT chest could be obtained for further evaluation as last CT study  available was in 2013. Electronically Signed   By: Donavan Foil M.D.   On: 08/18/2016 16:19   US Abdomen Complete  Result Date: 08/19/2016 CLINICAL DATA:  Elevated liver function tests, acute renal failure. EXAM: ABDOMEN ULTRASOUND COMPLETE COMPARISON:  None. FINDINGS: Gallbladder: Stones and sludge  are seen in the gallbladder. Largest stone measures approximately 6 mm. No wall thickening. Common bile duct: Diameter: 3 mm, within normal limits. Liver: Diffusely increased in echogenicity. IVC: No abnormality visualized. Pancreas: Somewhat obscured due to bowel gas. Spleen: Size and appearance within normal limits. Right Kidney: Length: 10.0 cm. Parenchymal echogenicity is within normal limits. Anechoic lesion with increased through transmission measures 3.4 x 3.4 x 3.2 cm. No hydronephrosis. Left Kidney: Length: 9.7 cm. An anechoic lesion with increased through transmission in the interpolar region measures 2.9 x 2.8 x 2.8 cm. No hydronephrosis. Abdominal aorta: Suboptimally visualized due to bowel. Maximum measured diameter, 2.5 cm. Other findings: None. IMPRESSION: 1. Examination is limited by patient condition. 2. Hepatic steatosis. 3. Gallstones and sludge. 4. Bilateral renal cysts. Electronically Signed   By: Lorin Picket M.D.   On: 08/19/2016 12:11   ASSESSMENT AND PLAN:   Cindy Tran  is a 83 y.o. female with a known history of A fib, femur fracture in march 2018- managed conservatively, breast cancer, DM, Htn- noted to have Ac renal filure at [facility], they tried oral hydration, but it did not help much so called PICC line team, while they tied-she had oozing blood around the PICC site and also had nausea and spit up some blood , so suggested to take her to ER.  * Ac renal failure -Baseline creatinine 06/17/2016 is 0.79 [facility] labs) -  IV fluids, avoid nephro toxins  -Renal US  - nephro consult appreciated -came in with creat of 4.80---4.17---3.2  * Patient had an episode where she spit up some blood not sure if this is true GI bleed  -hgb stable   GI consult noted---no luminal eval needed   Protonix IV for now. -Patient had upper GI endoscopy  On 07/03/16 at [facility] and showed gastric polyp that was removed. Normal esophagus normal duodenum -resume xarelto once creat stable  * A fib   Cont  cardizem and metoprolol oral, rate is better after getting IV fluids.   Hold xarelto for now--- now patient is bedbound and hence will resume Xarelto once creat stable -most recent echo at [facility] 06/2016  showed Left ventricular hypertrophy - mild  Normal left ventricular systolic function, ejection fraction > 55%  Dilated left atrium - mild  Mitral annular calcification  Aortic sclerosis  Aortic regurgitation - mild  Normal right ventricular systolic function  Tricuspid regurgitation - mild  Elevated pulmonary artery systolic pressure - mild  * elevated LFTs and Lipase US abdomen shows gallstones with sludge. Pt asymptomatic  * femur fracture in march 2018 (was admitted at [facility])   Conservative management per ortho for last 8 weeks, cont cast and folow in office in 2-3 weeks. Patient follows with [facility] or full  Case discussed with Care Management/Social Worker. Management plans discussed with the patient, family and they are in agreement.  CODE STATUS: DO NOT RESUSCITATE  DVT Prophylaxis: SCD  TOTAL TIME TAKING CARE OF THIS PATIENT: *30* minutes.  >50% time spent on counselling and coordination of care discussed with patient's daughter on the phone  POSSIBLE D/C IN 2-3 DAYS, DEPENDING ON CLINICAL CONDITION.  Note: This dictation was prepared with Dragon dictation along with smaller phrase  technology. Any transcriptional errors that result from this process are unintentional.  Amee Boothe M.D on 08/20/2016 at 1:29 PM  Between 7am to 6pm - Pager - [phone]  After 6pm go to [email or web address] - Proofreader  [facility]  Office  [phone]  CC: Primary care physician; [place], [facility]

## 2016-08-20 NOTE — Progress Notes (Signed)
Initial Nutrition Assessment  DOCUMENTATION CODES:   Not applicable  INTERVENTION:  1. Recommend Multivitamin with minerals 2. Nepro Shake po BID, each supplement provides 425 kcal and 19 grams protein  NUTRITION DIAGNOSIS:   Inadequate oral intake related to nausea, vomiting as evidenced by per patient/family report.  GOAL:   Patient will meet greater than or equal to 90% of their needs  MONITOR:   PO intake, I & O's, Labs, Weight trends  REASON FOR ASSESSMENT:   Malnutrition Screening Tool    ASSESSMENT:   Cindy Tran  is a 83 y.o. female with a known history of A fib, femur fracture in march 2018- managed conservatively, breast cancer, DM, Htn- noted to have Ac renal filure   Spoke with Cindy Tran at bedside. She was resting comfortably, had just eaten lunch. Patient consumed most of her lunch. Currently on renal diet due to AKI Documented PO 75-100% thus far She does not know of her normal weight, only weight on file 164# thus far Patient normally eats cereal and juice for breakfast, sandwich for lunch, eats snacks in evening "like potato chips, things like that." Had vomit bag on bed, has had active nausea/vomiting since admission, but seems to have subsided for the time being.   Denies any issues chewing/swallowing/choking Nutrition-Focused physical exam completed. Findings are no fat depletion, no muscle depletion, and no edema.   She does have multiple wounds  Labs and medications reviewed: NS @ 142ml/hr  Diet Order:  Diet renal with fluid restriction Fluid restriction: Other (see comments); Room service appropriate? Yes with Assist; Fluid consistency: Thin  Skin:  Wound (see comment) (Stg II to Ankle, Stg I to buttocks)  Last BM:  08/15/2016  Height:   Ht Readings from Last 1 Encounters:  08/18/16 5\' 5"  (1.651 m)    Weight:   Wt Readings from Last 1 Encounters:  08/18/16 164 lb (74.4 kg)    Ideal Body Weight:  56.81 kg  BMI:  Body mass index  is 27.29 kg/m.  Estimated Nutritional Needs:   Kcal:  1500-1900 calories  Protein:  74-97 gm  Fluid:  Per MD/NP/PA  EDUCATION NEEDS:   No education needs identified at this time  Cindy Tran. Cindy Brauner, MS, RD LDN Inpatient Clinical Dietitian Pager [phone]

## 2016-08-20 NOTE — Progress Notes (Signed)
Report from central Tele, pt in Afib with rate of 120-130, sustained. Paged MD. New order for cardiazem 30mg  q 6hrs.

## 2016-08-21 DIAGNOSIS — I4891 Unspecified atrial fibrillation: Secondary | ICD-10-CM

## 2016-08-21 DIAGNOSIS — D649 Anemia, unspecified: Secondary | ICD-10-CM

## 2016-08-21 DIAGNOSIS — I427 Cardiomyopathy due to drug and external agent: Secondary | ICD-10-CM

## 2016-08-21 DIAGNOSIS — Z950 Presence of cardiac pacemaker: Secondary | ICD-10-CM

## 2016-08-21 DIAGNOSIS — I482 Chronic atrial fibrillation, unspecified: Secondary | ICD-10-CM

## 2016-08-21 DIAGNOSIS — E876 Hypokalemia: Secondary | ICD-10-CM

## 2016-08-21 DIAGNOSIS — I429 Cardiomyopathy, unspecified: Secondary | ICD-10-CM

## 2016-08-21 DIAGNOSIS — E860 Dehydration: Secondary | ICD-10-CM

## 2016-08-21 DIAGNOSIS — T451X5A Adverse effect of antineoplastic and immunosuppressive drugs, initial encounter: Secondary | ICD-10-CM

## 2016-08-21 DIAGNOSIS — I495 Sick sinus syndrome: Secondary | ICD-10-CM

## 2016-08-21 LAB — PROTEIN ELECTROPHORESIS, SERUM
A/G Ratio: 0.6 — ABNORMAL LOW (ref 0.7–1.7)
ALBUMIN ELP: 2.3 g/dL — AB (ref 2.9–4.4)
ALPHA-1-GLOBULIN: 0.3 g/dL (ref 0.0–0.4)
Alpha-2-Globulin: 0.6 g/dL (ref 0.4–1.0)
Beta Globulin: 0.8 g/dL (ref 0.7–1.3)
GLOBULIN, TOTAL: 3.6 g/dL (ref 2.2–3.9)
Gamma Globulin: 1.9 g/dL — ABNORMAL HIGH (ref 0.4–1.8)
TOTAL PROTEIN ELP: 5.9 g/dL — AB (ref 6.0–8.5)

## 2016-08-21 LAB — GLUCOSE, CAPILLARY
GLUCOSE-CAPILLARY: 131 mg/dL — AB (ref 65–99)
GLUCOSE-CAPILLARY: 219 mg/dL — AB (ref 65–99)

## 2016-08-21 LAB — BASIC METABOLIC PANEL
ANION GAP: 6 (ref 5–15)
BUN: 82 mg/dL — ABNORMAL HIGH (ref 6–20)
CHLORIDE: 96 mmol/L — AB (ref 101–111)
CO2: 27 mmol/L (ref 22–32)
Calcium: 7.6 mg/dL — ABNORMAL LOW (ref 8.9–10.3)
Creatinine, Ser: 2.84 mg/dL — ABNORMAL HIGH (ref 0.44–1.00)
GFR calc non Af Amer: 14 mL/min — ABNORMAL LOW (ref >60)
GFR, EST AFRICAN AMERICAN: 16 mL/min — AB (ref >60)
Glucose, Bld: 149 mg/dL — ABNORMAL HIGH (ref 65–99)
POTASSIUM: 3.4 mmol/L — AB (ref 3.5–5.1)
SODIUM: 129 mmol/L — AB (ref 135–145)

## 2016-08-21 LAB — CBC
HCT: 25.5 % — ABNORMAL LOW (ref 35.0–47.0)
HEMOGLOBIN: 8.3 g/dL — AB (ref 12.0–16.0)
MCH: 28.1 pg (ref 26.0–34.0)
MCHC: 32.4 g/dL (ref 32.0–36.0)
MCV: 86.8 fL (ref 80.0–100.0)
PLATELETS: 176 10*3/uL (ref 150–440)
RBC: 2.94 MIL/uL — AB (ref 3.80–5.20)
RDW: 21.6 % — ABNORMAL HIGH (ref 11.5–14.5)
WBC: 8.7 10*3/uL (ref 3.6–11.0)

## 2016-08-21 LAB — MAGNESIUM: MAGNESIUM: 1.9 mg/dL (ref 1.7–2.4)

## 2016-08-21 LAB — TSH: TSH: 14.035 u[IU]/mL — AB (ref 0.350–4.500)

## 2016-08-21 MED ORDER — METOPROLOL TARTRATE 25 MG PO TABS
25.0000 mg | ORAL_TABLET | Freq: Three times a day (TID) | ORAL | Status: DC
  Administered 2016-08-21 (×2): 25 mg via ORAL
  Filled 2016-08-21 (×2): qty 1

## 2016-08-21 MED ORDER — POTASSIUM CHLORIDE CRYS ER 20 MEQ PO TBCR
40.0000 meq | EXTENDED_RELEASE_TABLET | ORAL | Status: AC
  Administered 2016-08-21: 40 meq via ORAL
  Filled 2016-08-21: qty 2

## 2016-08-21 MED ORDER — SODIUM CHLORIDE 0.9 % IV BOLUS (SEPSIS): 250.0000 mL | Freq: Once | INTRAVENOUS | Status: DC

## 2016-08-21 MED ORDER — DOCUSATE SODIUM 100 MG PO CAPS
100.0000 mg | ORAL_CAPSULE | Freq: Two times a day (BID) | ORAL | Status: DC
  Administered 2016-08-21 (×2): 100 mg via ORAL
  Filled 2016-08-21 (×4): qty 1

## 2016-08-21 MED ORDER — POLYETHYLENE GLYCOL 3350 17 G PO PACK
17.0000 g | PACK | Freq: Every day | ORAL | Status: DC
  Administered 2016-08-21: 17 g via ORAL
  Filled 2016-08-21 (×2): qty 1

## 2016-08-21 MED ORDER — SENNA 8.6 MG PO TABS
2.0000 | ORAL_TABLET | Freq: Every day | ORAL | Status: DC
  Administered 2016-08-21: 17.2 mg via ORAL
  Filled 2016-08-21 (×2): qty 2

## 2016-08-21 MED ORDER — SODIUM CHLORIDE 0.9 % IV BOLUS (SEPSIS)
500.0000 mL | Freq: Once | INTRAVENOUS | Status: AC
  Administered 2016-08-21: 500 mL via INTRAVENOUS

## 2016-08-21 MED ORDER — HEPARIN SODIUM (PORCINE) 5000 UNIT/ML IJ SOLN
5000.0000 [IU] | Freq: Three times a day (TID) | INTRAMUSCULAR | Status: DC
  Administered 2016-08-21 – 2016-08-22 (×3): 5000 [IU] via SUBCUTANEOUS
  Filled 2016-08-21 (×3): qty 1

## 2016-08-21 NOTE — Progress Notes (Signed)
Had nursing supervisor Colletta Maryland to come and look at patients arm prior to starting bolus and NS came and assessed arm and IV site. NS felt that iv site was good and arm does appear nore swollen. Arm propped on pillow. Pt states that arm does not hurt and that it is commonly swollen. Will cont to monitor

## 2016-08-21 NOTE — Progress Notes (Signed)
Clinical Education officer, museum (CSW) met with patient's daughter Charleston Ropes and granddaughter at bedside this morning. Per daughter she is patient's primary support and caregiver. Daughter reported that patient was recently at [facility] and discharged from there to [facility]. Per daughter patient is currently at [facility] doing short term rehab. Daughter requested for patient to return to [facility]. Per [facility] liaison patient can return to [facility] when stable. CSW will continue to follow and assist as needed.   [employer], LCSW [phone]

## 2016-08-21 NOTE — Consult Note (Signed)
Cardiology Consultation Note  Patient ID: Cindy Tran, MRN: [id number], DOB/AGE: [birth year] 84 y.o. Admit date: 08/18/2016   Date of Consult: 08/21/2016 Primary Physician: [facility] Primary Cardiologist: [facility] Requesting Physician: Dr. Posey Pronto, MD  Chief Complaint: Weakness Reason for Consult: Afib with RVR  HPI: Cindy Tran is a 83 y.o. female who is being seen today for the evaluation of Afib with RVR at the request of Dr. Posey Pronto, MD. Patient has a h/o chronic Afib on Xarelto at home, SSS s/p MDt PPM in 12/2011, cardiomyopathy due to chemotherapy, breast cancer s/p left-sided mastectomy and right-sided lumpectomy s/p chemo and radiation, DM2, HTN, prior TIA, obesity and recent femoral fracture in 06/2016 medically managed who presented to [facility] on 5/18 with increased weakness and fatigue in the setting of poor PO intake and dehydration. She was found to have ARF with a SCr of 4.80 (baseline 0.49) along with worsening anemia, and possible UTI. She went into Afib with RVR this morning and cardiology was asked to evaluate.   She is followed by [facility] for her cardiomyopathy and Afib/SSS. Most recent TTE from 06/2016 with preserved LVSF and mild pulmonary HTN. This was unchanged from prior study.   Following patient's femur fracture (at site of prior prosthesis), she has been medically managed. She has apparently been depressed with her nursing home situation and not eating or drinking well leading to dehydration and ARF. Upon her arrival to [facility] she was noted to have SCr 4.8 with a baseline of 0.79. Albumin 2.5. UA with possible UTI (was on ABX as an outpatient). hgB 9.8-->9.0 as of 5/19. K+ 4.7-->3.4. This morning she developed Afib with RVR with heart rates into the 140s bpm. Asymptomatic. She was started on PO diltiazem 30 mg q 6 hours with improvement in heart rate to the low 100s bpm.   Past Medical History:  Diagnosis Date  . Atrial fibrillation ([place])   . Breast cancer (HCC)      Left side  . Cancer ([place])   . Diabetes mellitus without complication ([place])   . Hypertension       Most Recent Cardiac Studies: TTE 06/2016: Left ventricular hypertrophy - mild  Normal left ventricular systolic function, ejection fraction > 55%  Dilated left atrium - mild  Mitral annular calcification  Aortic sclerosis  Aortic regurgitation - mild  Normal right ventricular systolic function  Tricuspid regurgitation - mild  Elevated pulmonary artery systolic pressure - mild   Surgical History: History reviewed. No pertinent surgical history.   Home Meds: Prior to Admission medications   Medication Sig Start Date End Date Taking? Authorizing Provider  atorvastatin (LIPITOR) 20 MG tablet Take 20 mg by mouth daily.   Yes [provider]  Cholecalciferol (D3-1000 PO) Take 1,000 Units by mouth daily.   Yes [provider]  diltiazem (CARDIZEM CD) 180 MG 24 hr capsule Take 180 mg by mouth daily. 05/19/16  Yes [provider]  docusate sodium (COLACE) 100 MG capsule Take 100 mg by mouth 2 (two) times daily.   Yes [provider]  enalapril (VASOTEC) 20 MG tablet Take 20 mg by mouth 2 (two) times daily. 01/07/16 01/06/17 Yes [provider]  esomeprazole (NEXIUM) 40 MG capsule Take 40 mg by mouth daily. 05/12/16  Yes [provider]  ferrous sulfate 325 (65 FE) MG tablet Take 325 mg by mouth 2 (two) times daily. 06/02/16  Yes [provider]  furosemide (LASIX) 40 MG tablet Take 40 mg by mouth daily.  12/09/15 12/08/16 Yes [provider]  levothyroxine (SYNTHROID, LEVOTHROID) 100 MCG tablet Take 100 mcg by mouth daily before breakfast.   Yes [provider]  metoprolol succinate (TOPROL-XL) 50 MG 24 hr tablet Take 50 mg by mouth daily. Take with or immediately following a meal.   Yes [provider]  ondansetron (ZOFRAN) 4 MG tablet Take 4 mg by mouth every 8 (eight) hours as needed for nausea or vomiting.    Yes [provider]  polyethylene glycol (MIRALAX / GLYCOLAX) packet Take 17 g by mouth daily.   Yes [provider]  promethazine (PHENERGAN) 6.25 MG/5ML syrup Take 12.5 mg by mouth every 6 (six) hours as needed for nausea or vomiting.   Yes [provider]  senna (SENOKOT) 8.6 MG tablet Take 2 tablets by mouth at bedtime.   Yes [provider]  vitamin B-12 (CYANOCOBALAMIN) 1000 MCG tablet Take 1,000 mcg by mouth daily.   Yes [provider]  XARELTO 20 MG TABS tablet Take 20 mg by mouth daily. 05/25/16  Yes [provider]    Inpatient Medications:  . atorvastatin  20 mg Oral Daily  . diltiazem  30 mg Oral Q6H  . feeding supplement (NEPRO CARB STEADY)  237 mL Oral BID BM  . levothyroxine  100 mcg Oral QAC breakfast  . ondansetron  4 mg Oral Once  . pantoprazole  40 mg Oral Daily  . potassium chloride  40 mEq Oral STAT   . sodium chloride 100 mL/hr at 08/21/16 0431    Allergies: No Known Allergies  Social History   Social History  . Marital status: Divorced    Spouse name: N/A  . Number of children: N/A  . Years of education: N/A   Occupational History  . Not on file.   Social History Main Topics  . Smoking status: Never Smoker  . Smokeless tobacco: Never Used  . Alcohol use No  . Drug use: Unknown  . Sexual activity: Not on file   Other Topics Concern  . Not on file   Social History Narrative  . No narrative on file     Family History  Problem Relation Age of Onset  . Hypertension Mother      Review of Systems: Review of Systems  Unable to perform ROS: Medical condition    Labs:  Recent Labs  08/18/16 1232  TROPONINI 0.10*   Lab Results  Component Value Date   WBC 9.2 08/19/2016   HGB 9.0 (L) 08/19/2016   HCT 26.7 (L) 08/19/2016   MCV 83.7 08/19/2016   PLT 173 08/19/2016    Recent Labs Lab 08/19/16 0536  08/21/16 0405  NA 127*  < > 129*  K 4.3  < > 3.4*  CL 88*  < > 96*  CO2 28  <  > 27  BUN 102*  < > 82*  CREATININE 4.17*  < > 2.84*  CALCIUM 7.8*  < > 7.6*  PROT 6.8  --   --   BILITOT 1.2  --   --   ALKPHOS 96  --   --   ALT 304*  --   --   AST 68*  --   --   GLUCOSE 60*  < > 149*  < > = values in this interval not displayed. No results found for: CHOL, HDL, LDLCALC, TRIG No results found for: DDIMER  Radiology/Studies:  Dg Chest 1 View  Result Date: 08/18/2016 CLINICAL DATA:  PICC line placement  EXAM: CHEST 1 VIEW COMPARISON:  07/04/2011 FINDINGS: Right-sided duo lead pacing device. Surgical clips in the right breast and right axilla. Elevated right diaphragm with small right pleural effusion with atelectasis or scarring at the right base. Left lung is clear. Stable borderline cardiomegaly with atherosclerosis. Right hilar opacity. Right upper extremity catheter tip positioned over the upper arm. IMPRESSION: 1. Right upper extremity catheter tip appears positioned over the right upper arm. No catheter tubing over the chest. 2. Elevated right diaphragm with small right pleural effusion and right basilar atelectasis, scar or infiltrate. 3. Right hilar soft tissue fullness, unable to exclude adenopathy or mass in the region. CT chest could be obtained for further evaluation as last CT study available was in 2013. Electronically Signed   By: Donavan Foil M.D.   On: 08/18/2016 16:19   US Abdomen Complete  Result Date: 08/19/2016 CLINICAL DATA:  Elevated liver function tests, acute renal failure. EXAM: ABDOMEN ULTRASOUND COMPLETE COMPARISON:  None. FINDINGS: Gallbladder: Stones and sludge are seen in the gallbladder. Largest stone measures approximately 6 mm. No wall thickening. Common bile duct: Diameter: 3 mm, within normal limits. Liver: Diffusely increased in echogenicity. IVC: No abnormality visualized. Pancreas: Somewhat obscured due to bowel gas. Spleen: Size and appearance within normal limits. Right Kidney: Length: 10.0 cm. Parenchymal echogenicity is within normal  limits. Anechoic lesion with increased through transmission measures 3.4 x 3.4 x 3.2 cm. No hydronephrosis. Left Kidney: Length: 9.7 cm. An anechoic lesion with increased through transmission in the interpolar region measures 2.9 x 2.8 x 2.8 cm. No hydronephrosis. Abdominal aorta: Suboptimally visualized due to bowel. Maximum measured diameter, 2.5 cm. Other findings: None. IMPRESSION: 1. Examination is limited by patient condition. 2. Hepatic steatosis. 3. Gallstones and sludge. 4. Bilateral renal cysts. Electronically Signed   By: Lorin Picket M.D.   On: 08/19/2016 12:11    EKG: Interpreted by me showed: Afib with RVR, 106 bpm, LVH, TWI, II, III, aVF, V3-V6 Telemetry: Interpreted by me showed: Afib with RVR, currently in the low 100s bpm  Weights: Filed Weights   08/18/16 1208  Weight: 164 lb (74.4 kg)     Physical Exam: Blood pressure (!) 93/39, pulse (!) 105, temperature 97.8 F (36.6 C), temperature source Oral, resp. rate 16, height 5\' 5"  (1.651 m), weight 164 lb (74.4 kg), SpO2 93 %. Body mass index is 27.29 kg/m. General: Well developed, well nourished, in no acute distress. Head: Normocephalic, atraumatic, sclera non-icteric, no xanthomas, nares are without discharge.  Neck: Negative for carotid bruits. JVD not elevated. Lungs: Diminished breath sounds bilaterally. Breathing is unlabored. Heart: Irregularly irregular with S1 S2. II/VI systolic murmur, no rubs, or gallops appreciated. Abdomen: Obese, soft, non-tender, non-distended with normoactive bowel sounds. No hepatomegaly. No rebound/guarding. No obvious abdominal masses. Msk:  Strength and tone appear normal for age. Extremities: No clubbing or cyanosis. No edema. Distal pedal pulses are 2+ and equal bilaterally. Neuro: Alert. No facial asymmetry (right-sided facial droop when sleeping, when wakes droop resolves). No focal deficit. Moves all extremities spontaneously. Psych:  Somnolent.    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 Chronic Afib with RVR: -Heart rates currently improved to the  low 100s bpm on diltiazem 30 mg q 6 hours -Possibly in the setting of her hypokalemia, down trending hgb to 9.0 as of 5/19, UTI, AKI/dehydration/GI bleed -Recommend treating underlying driving forces initially as this will lead to improvement in heart rate -Heart rate improved with diltiazem 30 mg q 6 hours -Will change to metoprolol 25 mg tid as this may have less BP effect (hold parameters given in orders) -BP precludes addition of beta blocker at this time -Renal function precludes addition on digoxin -On Xarelto coming in, has been held this admission given renal function -If renal function remains elevated may need heparin to Coumadin bridge pending hgb/hct -CHADS2VASc at least 8 (CHF, HTN, age x 2, DM, TIA x 2, female)  2. Cardiomyopathy due to chemotherapy: -Most recent echo from 06/2016 with preserved LSVF with mildly elevated PASP, unchanged from prior -Metoprolol on hold given hypotension -She does not appear to be grossly volume overloaded -Check echo  3. SSS: -Status post MDT/PPM in 12/2011 -Follow[facility] by [facility]  4. ARF/dehydration/malnutrition: -Has not been eating or drinking well since transferring nursing facilities  -Renal function improving with IV fluids -Renal on board -Albumin of 2.5 upon admission, would benefit from dietary consult, likely playing a role in some 3rd spacing  5. GI bleed/acute on IDA: -Hgb has been down trending -Status post EGD with excision of gastric polyp, no active bleed -Check cbc this morning to evaluate if hgb is continuing to down trend playing a role in her Afib/weakness/SOB -Xarelto on hold this  admission as above  6. Hypokalemia: -Replete to goal of at least 4.0 -Check magnesium and replete to goal of 2.0 as indicated   7. Elevated transaminases: -Abdominal US with gallstones and sludge -Per IM  8. Recent femur fracture: -Medically managed  9. RUQ swelling: -Chronic issue for her -Prior upper extremity ultrasound negative for DVT -Has been on Xarelto as above -less likely DVT -Possibly from 3rd spacing as above  Signed, Christell Faith, PA-C [place] Pager: [phone] 08/21/2016, 10:28 AM

## 2016-08-21 NOTE — Progress Notes (Signed)
Report given to [place], Therapist, sports. Pt transferred to room 248. Of note, pt now has blood return to site in RUA, ivf restarted by this writer per orders.

## 2016-08-21 NOTE — Progress Notes (Signed)
Shift assessment completed. Pt found on room air with sat of 84%, pt placed on 2LNC and sat increased to 93%. Pt is drowsy on waking. Lungs are decreased to bilat bases. HR is irregular, telebox in place, a fib is noted. Abdomen is soft, bs hypoactive. Foley draining urine, light colored. R leg has black immobilizer in place and blue boot on foot, L leg with scd intact, cap refill is wnl,ppp, faint. piv #20 intact to r shoulder, site is warm and soft, edematous. Pt denied pain. Since assessment, this writer notified Dr. Posey Pronto about sbp of 93, map 52, and hr sustained 120's to 130's. Dr. Posey Pronto placed cardiology consult, which has since been completed and transfer order in place to telemetry floor. IV site now infiltrated to R arm and ivf dc'd, stat consult placed for iv team to replace picc or restart iv. Family members at bedside. Nephrology has rounded on pt as well.

## 2016-08-21 NOTE — Progress Notes (Signed)
[facility]  ROUNDING NOTE   Subjective:   Family at bedside, sister is a dialysis patient.  UOP 1525 Na 129 (129)  Creatinine 2.84 (3.25)  NS at 185mL/hr   Objective:  Vital signs in last 24 hours:  Temp:  [97.8 F (36.6 C)-98.8 F (37.1 C)] 97.8 F (36.6 C) (05/21 0740) Pulse Rate:  [74-172] 105 (05/21 0740) Resp:  [16-24] 16 (05/21 0740) BP: (72-107)/(37-61) 93/39 (05/21 0740) SpO2:  [91 %-96 %] 93 % (05/21 0740)  Weight change:  Filed Weights   08/18/16 1208  Weight: 74.4 kg (164 lb)    Intake/Output: I/O last 3 completed shifts: In: 5273.3 [P.O.:1000; I.V.:3773.3; IV Piggyback:500] Out: 1525 [Urine:1525]   Intake/Output this shift:  No intake/output data recorded.  Physical Exam: General: No acute distress  Head: Normocephalic, atraumatic. Moist oral mucosal membranes  Eyes: Anicteric  Neck: Supple, trachea midline  Lungs:  Clear to auscultation, normal effort  Heart: Tachycardia, irregular  Abdomen:  Soft, nontender, bowel sounds present  Extremities: Trace peripheral edema.  Neurologic: Not awake, not alert  Skin: No lesions       Basic Metabolic Panel:  Recent Labs Lab 08/18/16 1232 08/19/16 0536 08/20/16 0953 08/21/16 0405  NA 123* 127* 129* 129*  K 4.5 4.3 4.7 3.4*  CL 82* 88* 95* 96*  CO2 30 28 24 27   GLUCOSE 104* 60* 119* 149*  BUN 101* 102* 90* 82*  CREATININE 4.80* 4.17* 3.25* 2.84*  CALCIUM 8.2* 7.8* 7.8* 7.6*  MG  --   --   --  1.9    Liver Function Tests:  Recent Labs Lab 08/18/16 1232 08/19/16 0536  AST 97* 68*  ALT 434* 304*  ALKPHOS 110 96  BILITOT 1.2 1.2  PROT 7.6 6.8  ALBUMIN 2.8* 2.5*    Recent Labs Lab 08/18/16 1232 08/19/16 0536  LIPASE 146* 124*   No results for input(s): AMMONIA in the last 168 hours.  CBC:  Recent Labs Lab 08/18/16 1232 08/19/16 0536 08/21/16 0952  WBC 9.5 9.2 8.7  NEUTROABS 8.2*  --   --   HGB 9.8* 9.0* 8.3*  HCT 29.5* 26.7* 25.5*  MCV 83.9 83.7 86.8   PLT 171 173 176    Cardiac Enzymes:  Recent Labs Lab 08/18/16 1232  TROPONINI 0.10*    BNP: Invalid input(s): POCBNP  CBG:  Recent Labs Lab 08/19/16 0804 08/19/16 0954 08/19/16 1222 08/21/16 0825  GLUCAP 61* 84 45 131*    Microbiology: Results for orders placed or performed during the hospital encounter of 08/18/16  MRSA PCR Screening     Status: None   Collection Time: 08/18/16 10:34 PM  Result Value Ref Range Status   MRSA by PCR NEGATIVE NEGATIVE Final    Comment:        The GeneXpert MRSA Assay (FDA approved for NASAL specimens only), is one component of a comprehensive MRSA colonization surveillance program. It is not intended to diagnose MRSA infection nor to guide or monitor treatment for MRSA infections.     Coagulation Studies: No results for input(s): LABPROT, INR in the last 72 hours.  Urinalysis:  Recent Labs  08/18/16 1740  COLORURINE YELLOW*  LABSPEC 1.009  PHURINE 5.0  GLUCOSEU NEGATIVE  HGBUR MODERATE*  BILIRUBINUR NEGATIVE  KETONESUR NEGATIVE  PROTEINUR 100*  NITRITE NEGATIVE  LEUKOCYTESUR LARGE*      Imaging: US Abdomen Complete  Result Date: 08/19/2016 CLINICAL DATA:  Elevated liver function tests, acute renal failure. EXAM: ABDOMEN ULTRASOUND COMPLETE COMPARISON:  None. FINDINGS: Gallbladder: Stones and sludge are seen in the gallbladder. Largest stone measures approximately 6 mm. No wall thickening. Common bile duct: Diameter: 3 mm, within normal limits. Liver: Diffusely increased in echogenicity. IVC: No abnormality visualized. Pancreas: Somewhat obscured due to bowel gas. Spleen: Size and appearance within normal limits. Right Kidney: Length: 10.0 cm. Parenchymal echogenicity is within normal limits. Anechoic lesion with increased through transmission measures 3.4 x 3.4 x 3.2 cm. No hydronephrosis. Left Kidney: Length: 9.7 cm. An anechoic lesion with increased through transmission in the interpolar region measures 2.9 x 2.8  x 2.8 cm. No hydronephrosis. Abdominal aorta: Suboptimally visualized due to bowel. Maximum measured diameter, 2.5 cm. Other findings: None. IMPRESSION: 1. Examination is limited by patient condition. 2. Hepatic steatosis. 3. Gallstones and sludge. 4. Bilateral renal cysts. Electronically Signed   By: Lorin Picket M.D.   On: 08/19/2016 12:11     Medications:   . sodium chloride 100 mL/hr at 08/21/16 0431   . atorvastatin  20 mg Oral Daily  . feeding supplement (NEPRO CARB STEADY)  237 mL Oral BID BM  . levothyroxine  100 mcg Oral QAC breakfast  . metoprolol tartrate  25 mg Oral TID  . ondansetron  4 mg Oral Once  . pantoprazole  40 mg Oral Daily   acetaminophen, docusate sodium, ondansetron (ZOFRAN) IV  Assessment/ Plan:  83 y.o. black female with Atrial fibrillation, left breast cancer, diabetes mellitus type 2, hypertension, history of femur fracture managed conservatively in March 2018 who was admitted to [facility] on 08/18/2016 for evaluation of acute renal failure.   1. Acute renal failure with baseline GFR >60 from 06/17/2016. Acute renal failure from prolonged prerenal azotemia. Improving with IV NS   2. Hyponatremia: hypovolemic - improving with saline administration.   3. Anemia with renal failure: hemoglobin 8.3  4. Hypertension and atrial fibrillation: irregular. Patient to be transferred to telemetry.    LOS: 3 Ainsley Deakins 5/21/201811:36 AM

## 2016-08-21 NOTE — Progress Notes (Signed)
B/P improved 103/60 (70) New order for metoprolol for HR control given.

## 2016-08-21 NOTE — Progress Notes (Signed)
Patient transferred to unit for uncontrolled HR and hypotension per cardiology request. Upon arrival HR 110 and B/P 88/47 (54). Patient's O2 saturations 99% on 2LPM [place]. Dr. Posey Pronto notified. New order for 279ml NS Bolus given.

## 2016-08-21 NOTE — Progress Notes (Signed)
MD paged to notify of pt low bp and right arm appearing to be more swollen than earlier, explained where current iv is and rate of fluids and that swelling is dependent to IV. MD ordered to give 520ml bolus and keep watch arm during infusion. Will cont to monitor

## 2016-08-21 NOTE — Progress Notes (Signed)
Assessed patient for IV.  Current right shoulder IV patent and infusing IVF at 500cc/hr.  Ultrasound used to assess right arm for PIV but found that there are no usable sites.  Assessed upper arm for PICC and found that there are no suitable sites.  Patient does have visible veins in left hand and foot if PIV is needed emergently with doctor's approval for use of these sites.  Velna Hatchet, RN update.  Carolee Rota, RN VAST

## 2016-08-21 NOTE — Care Management (Signed)
Transferred to 2A due to sustained heart rate 120-130's. Was also found to be hypoxic on RA with sat 84%.  Plan has been discharge to [facility] when medically stable.

## 2016-08-21 NOTE — Progress Notes (Signed)
[place] at [place] NAME: Cindy Tran    MR#:  [id number]  DATE OF BIRTH:  1931-03-05  SUBJECTIVE:  Poor historian Came in with weakness and spitting some blood. Found to be dehydrated More weak and lethargic. Some nausea HR up in 120's and afib BP 93/39 dter in the room REVIEW OF SYSTEMS:   Review of Systems  Constitutional: Negative for chills, fever and weight loss.  HENT: Negative for ear discharge, ear pain and nosebleeds.   Eyes: Negative for blurred vision, pain and discharge.  Respiratory: Negative for sputum production, shortness of breath, wheezing and stridor.   Cardiovascular: Negative for chest pain, palpitations, orthopnea and PND.  Gastrointestinal: Negative for abdominal pain, diarrhea, nausea and vomiting.  Genitourinary: Negative for frequency and urgency.  Musculoskeletal: Negative for back pain and joint pain.  Neurological: Positive for weakness. Negative for sensory change, speech change and focal weakness.  Psychiatric/Behavioral: Negative for depression and hallucinations. The patient is not nervous/anxious.    Tolerating Diet:CLD Tolerating PT: bed bound due to femur fracture  DRUG ALLERGIES:  No Known Allergies  VITALS:  Blood pressure (!) 93/39, pulse (!) 105, temperature 97.8 F (36.6 C), temperature source Oral, resp. rate 16, height 5\' 5"  (1.651 m), weight 74.4 kg (164 lb), SpO2 93 %.  PHYSICAL EXAMINATION:   Physical Exam  GENERAL:  83 y.o.-year-old patient lying in the bed with no acute distress. obese EYES: Pupils equal, round, reactive to light and accommodation. No scleral icterus. Extraocular muscles intact.  HEENT: Head atraumatic, normocephalic. Oropharynx and nasopharynx clear.  NECK:  Supple, no jugular venous distention. No thyroid enlargement, no tenderness.  LUNGS: Normal breath sounds bilaterally, no wheezing, rales, rhonchi. No use of accessory muscles of respiration.   CARDIOVASCULAR: S1, S2 normal. No murmurs, rubs, or gallops. Tachycardia, afib 120's ABDOMEN: Soft, nontender, nondistended. Bowel sounds present. No organomegaly or mass.  EXTREMITIES: No cyanosis, clubbing or edema b/l.   Right leg immobilizer NEUROLOGIC: Cranial nerves II through XII are intact. No focal Motor or sensory deficits b/l.   PSYCHIATRIC:  patient is alert on VC but very weak and lethargic.  SKIN: No obvious rash, lesion, or ulcer.   LABORATORY PANEL:  CBC  Recent Labs Lab 08/21/16 0952  WBC 8.7  HGB 8.3*  HCT 25.5*  PLT 176    Chemistries   Recent Labs Lab 08/19/16 0536  08/21/16 0405  NA 127*  < > 129*  K 4.3  < > 3.4*  CL 88*  < > 96*  CO2 28  < > 27  GLUCOSE 60*  < > 149*  BUN 102*  < > 82*  CREATININE 4.17*  < > 2.84*  CALCIUM 7.8*  < > 7.6*  MG  --   --  1.9  AST 68*  --   --   ALT 304*  --   --   ALKPHOS 96  --   --   BILITOT 1.2  --   --   < > = values in this interval not displayed. Cardiac Enzymes  Recent Labs Lab 08/18/16 1232  TROPONINI 0.10*   RADIOLOGY:  No results found. ASSESSMENT AND PLAN:   Cindy Tran  is a 83 y.o. female with a known history of A fib, femur fracture in march 2018- managed conservatively, breast cancer, DM, Htn- noted to have Ac renal filure at [facility], they tried oral hydration, but it did not help much so called PICC line team,  while they tied-she had oozing blood around the PICC site and also had nausea and spit up some blood , so suggested to take her to ER.  * Ac renal failure appears pre-renal azotemia due to poor po intake -Baseline creatinine 06/17/2016 is 0.79 [facility] labs) -  IV fluids for now -avoid nephr toxins  -Renal US  - nephro consult appreciated -came in with creat of 4.80---4.17---3.2--2.8  * Patient had an episode where she spit up some blood not sure if this is true GI bleed  -hgb stable   GI consult noted---no luminal eval needed   Protonix IV for now. -Patient had upper GI endoscopy   On 07/03/16 at [facility] and showed gastric polyp that was removed. Normal esophagus normal duodenum -resume xarelto once creat stable -Colonoscopy done in 2016 showed few polyps that were removed.  * A fib   Cont cardizem and metoprolol oral, rate is better after getting IV fluids.   Hold xarelto for now--- now patient is bedbound and hence will resume Xarelto once creat stable -most recent echo at [facility] 06/2016  showed Left ventricular hypertrophy - mild  Normal left ventricular systolic function, ejection fraction > 55% Dilated left atrium - mild Mitral annular calcification  Aortic sclerosis, Aortic regurgitation - mild Normal right ventricular systolic function Tricuspid regurgitation - mild Elevated pulmonary artery systolic pressure - mild -Cardiology consultation for rapid A. fib with RVR. -Transfer to telemetry  * elevated LFTs and Lipase US abdomen shows gallstones with sludge. Pt asymptomatic  * femur fracture in march 2018 (was admitted at [facility])   Conservative management per ortho for last 8 weeks, cont cast and folow in office in 2-3 weeks. [facility] with [facility]  Discussed with patient's daughter Luciano Cutter and she was requesting if I can transfer patient to [facility]. Spoke with Dr. Jacqulyn Ducking at [facility] and patient is on a transfer list at present.  Spoke with patient's daughter she has lot of chronic medical issues and she did voice understanding.  Case discussed with Care Management/Social Worker. Management plans discussed with the patient, family and they are in agreement.  CODE STATUS: DO NOT RESUSCITATE  DVT Prophylaxis: heparin  TOTAL TIME TAKING CARE OF THIS PATIENT: *30* minutes.  >50% time spent on counselling and coordination of care discussed with patient's daughter on the phone  POSSIBLE D/C IN few DAYS, DEPENDING ON CLINICAL CONDITION.  Note: This dictation was prepared with Dragon dictation along with smaller phrase technology. Any transcriptional errors  that result from this process are unintentional.  Burle Kwan M.D on 08/21/2016 at 12:36 PM  Between 7am to 6pm - Pager - [phone]  After 6pm go to [email or web address] - Proofreader  [facility]  Office  [phone]  CC: Primary care physician; [place], [facility]

## 2016-08-22 ENCOUNTER — Inpatient Hospital Stay: Payer: Medicare Other

## 2016-08-22 DIAGNOSIS — N179 Acute kidney failure, unspecified: Principal | ICD-10-CM

## 2016-08-22 DIAGNOSIS — E039 Hypothyroidism, unspecified: Secondary | ICD-10-CM

## 2016-08-22 DIAGNOSIS — R627 Adult failure to thrive: Secondary | ICD-10-CM

## 2016-08-22 DIAGNOSIS — I427 Cardiomyopathy due to drug and external agent: Secondary | ICD-10-CM

## 2016-08-22 LAB — CBC
HCT: 29.5 % — ABNORMAL LOW (ref 35.0–47.0)
Hemoglobin: 9.4 g/dL — ABNORMAL LOW (ref 12.0–16.0)
MCH: 27.4 pg (ref 26.0–34.0)
MCHC: 31.8 g/dL — AB (ref 32.0–36.0)
MCV: 86 fL (ref 80.0–100.0)
PLATELETS: 222 10*3/uL (ref 150–440)
RBC: 3.42 MIL/uL — AB (ref 3.80–5.20)
RDW: 22 % — AB (ref 11.5–14.5)
WBC: 11.5 10*3/uL — ABNORMAL HIGH (ref 3.6–11.0)

## 2016-08-22 LAB — BASIC METABOLIC PANEL
Anion gap: 5 (ref 5–15)
BUN: 71 mg/dL — AB (ref 6–20)
CHLORIDE: 105 mmol/L (ref 101–111)
CO2: 24 mmol/L (ref 22–32)
CREATININE: 2.25 mg/dL — AB (ref 0.44–1.00)
Calcium: 8.4 mg/dL — ABNORMAL LOW (ref 8.9–10.3)
GFR calc Af Amer: 22 mL/min — ABNORMAL LOW (ref >60)
GFR calc non Af Amer: 19 mL/min — ABNORMAL LOW (ref >60)
GLUCOSE: 84 mg/dL (ref 65–99)
Potassium: 4.3 mmol/L (ref 3.5–5.1)
Sodium: 134 mmol/L — ABNORMAL LOW (ref 135–145)

## 2016-08-22 LAB — GLUCOSE, CAPILLARY: GLUCOSE-CAPILLARY: 86 mg/dL (ref 65–99)

## 2016-08-22 MED ORDER — RIVAROXABAN 15 MG PO TABS
15.0000 mg | ORAL_TABLET | Freq: Every day | ORAL | Status: DC
  Administered 2016-08-22: 15 mg via ORAL
  Filled 2016-08-22: qty 1

## 2016-08-22 MED ORDER — METOPROLOL TARTRATE 25 MG PO TABS
25.0000 mg | ORAL_TABLET | Freq: Four times a day (QID) | ORAL | Status: DC
  Administered 2016-08-22: 25 mg via ORAL
  Filled 2016-08-22 (×3): qty 1

## 2016-08-22 MED ORDER — DEXTROSE 5 % IV SOLN: 1.0000 g | INTRAVENOUS | Status: DC

## 2016-08-22 MED ORDER — DEXTROSE 5 % IV SOLN: 1.0000 g | INTRAVENOUS | 0 refills | Status: AC

## 2016-08-22 MED ORDER — TRAMADOL HCL 50 MG PO TABS
50.0000 mg | ORAL_TABLET | Freq: Four times a day (QID) | ORAL | Status: DC | PRN
  Administered 2016-08-22: 50 mg via ORAL
  Filled 2016-08-22: qty 1

## 2016-08-22 MED ORDER — DEXTROSE 5 % IV SOLN
1.0000 g | INTRAVENOUS | Status: DC
  Administered 2016-08-22: 1 g via INTRAVENOUS
  Filled 2016-08-22 (×2): qty 10

## 2016-08-22 MED ORDER — METOPROLOL TARTRATE 25 MG PO TABS: 25.0000 mg | ORAL_TABLET | Freq: Four times a day (QID) | ORAL | 0 refills | Status: AC

## 2016-08-22 NOTE — Progress Notes (Addendum)
[place] at [place] NAME: Cindy Tran    MR#:  [id number]  DATE OF BIRTH:  1930/08/06  SUBJECTIVE:  Poor historian Son in the room Pt's HR in the 100's Weak and deconditioned. Not eating much REVIEW OF SYSTEMS:   Review of Systems  Constitutional: Negative for chills, fever and weight loss.  HENT: Negative for ear discharge, ear pain and nosebleeds.   Eyes: Negative for blurred vision, pain and discharge.  Respiratory: Negative for sputum production, shortness of breath, wheezing and stridor.   Cardiovascular: Negative for chest pain, palpitations, orthopnea and PND.  Gastrointestinal: Negative for abdominal pain, diarrhea, nausea and vomiting.  Genitourinary: Negative for frequency and urgency.  Musculoskeletal: Negative for back pain and joint pain.  Neurological: Positive for weakness. Negative for sensory change, speech change and focal weakness.  Psychiatric/Behavioral: Negative for depression and hallucinations. The patient is not nervous/anxious.    Tolerating Diet soft Tolerating PT: bed bound due to femur fracture  DRUG ALLERGIES:  No Known Allergies  VITALS:  Blood pressure 97/60, pulse (!) 121, temperature 97.7 F (36.5 C), temperature source Axillary, resp. rate 20, height 5\' 5"  (1.651 m), weight 82.1 kg (181 lb), SpO2 98 %.  PHYSICAL EXAMINATION:   Physical Exam  GENERAL:  83 y.o.-year-old patient lying in the bed with no acute distress. obese EYES: Pupils equal, round, reactive to light and accommodation. No scleral icterus. Extraocular muscles intact.  HEENT: Head atraumatic, normocephalic. Oropharynx and nasopharynx clear.  NECK:  Supple, no jugular venous distention. No thyroid enlargement, no tenderness.  LUNGS: distant breath sounds bilaterally, no wheezing, rales, rhonchi. No use of accessory muscles of respiration.  CARDIOVASCULAR: S1, S2 normal. No murmurs, rubs, or gallops. Tachycardia, afib  100's ABDOMEN: Soft, nontender, nondistended. Bowel sounds present. No organomegaly or mass.  EXTREMITIES: No cyanosis, clubbing or edema b/l.   Right leg immobilizer NEUROLOGIC: Cranial nerves II through XII are intact. No focal Motor or sensory deficits b/l.   PSYCHIATRIC:  patient is alert on VC but very weak and lethargic.  SKIN: No obvious rash, lesion, or ulcer.   LABORATORY PANEL:  CBC  Recent Labs Lab 08/21/16 0952  WBC 8.7  HGB 8.3*  HCT 25.5*  PLT 176    Chemistries   Recent Labs Lab 08/19/16 0536  08/21/16 0405 08/22/16 0516  NA 127*  < > 129* 134*  K 4.3  < > 3.4* 4.3  CL 88*  < > 96* 105  CO2 28  < > 27 24  GLUCOSE 60*  < > 149* 84  BUN 102*  < > 82* 71*  CREATININE 4.17*  < > 2.84* 2.25*  CALCIUM 7.8*  < > 7.6* 8.4*  MG  --   --  1.9  --   AST 68*  --   --   --   ALT 304*  --   --   --   ALKPHOS 96  --   --   --   BILITOT 1.2  --   --   --   < > = values in this interval not displayed. Cardiac Enzymes  Recent Labs Lab 08/18/16 1232  TROPONINI 0.10*   RADIOLOGY:  No results found. ASSESSMENT AND PLAN:   Cindy Tran  is a 83 y.o. female with a known history of A fib, femur fracture in march 2018- managed conservatively, breast cancer, DM, Htn- noted to have Ac renal filure at [facility], they tried oral hydration, but  it did not help much so called PICC line team, while they tied-she had oozing blood around the PICC site and also had nausea and spit up some blood , so suggested to take her to ER.  * Ac renal failure appears pre-renal azotemia due to poor po intake -Baseline creatinine 06/17/2016 is 0.79 [facility] labs) -  IV fluids for now--decrease rate to 75 ml/hr -avoid nephrotoxins  -Renal US  - nephro consult appreciated -came in with creat of 4.80---4.17---3.2--2.8--2.2  * UTI GNR on UC Start IV rocephin  * Patient had an episode where she spit up some blood not sure if this is true GI bleed  -hgb stable   GI consult noted---no luminal eval  needed   Protonix IV for now. -Patient had upper GI endoscopy  On 07/03/16 at [facility] and showed gastric polyp that was removed. Normal esophagus normal duodenum -resume xarelto once creat stable -Colonoscopy done in 2016 showed few polyps that were removed.  * A fib - Cont cardizem and metoprolol oral, rate is better after getting IV fluids. - will resume Xarelto now that creat is improving -most recent echo at [facility] 06/2016  Showed Left ventricular hypertrophy - mild Normal left ventricular systolic function, ejection fraction > 55% Dilated left atrium - mild Mitral annular calcification  Aortic sclerosis, Aortic regurgitation - mild Normal right ventricular systolic function Tricuspid regurgitation - mild Elevated pulmonary artery systolic pressure - mild -Cardiology consultation for rapid A. fib with RVR with Dr Fletcher Anon appreciated. Pt is now on metoprolol 25 mg q 6 hourly  * elevated LFTs and Lipase US abdomen shows gallstones with sludge. Pt asymptomatic  * femur fracture in march 2018 (was admitted at [facility]) -Conservative management per ortho for last 8 weeks, cont cast/right knee immobiliser and folow in office in 2-3 weeks. -Patient follows with [facility]  Discussed with patient's daughter Luciano Cutter and she was requesting if I can transfer patient to [facility]. Spoke with Dr. Jacqulyn Ducking at [facility] and patient is on a transfer list at present.  Spoke with patient's daughter she has lot of chronic medical issues and she did voice understanding.  D/c planning to [facility] when ready  Case discussed with Care Management/Social Worker. Management plans discussed with the patient, family and they are in agreement.  CODE STATUS: DO NOT RESUSCITATE  DVT Prophylaxis: Xarelto  TOTAL TIME TAKING CARE OF THIS PATIENT: *30* minutes.  >50% time spent on counselling and coordination of care discussed with patient's daughter on the phone  POSSIBLE D/C IN few DAYS, DEPENDING ON CLINICAL CONDITION.  Note: This  dictation was prepared with Dragon dictation along with smaller phrase technology. Any transcriptional errors that result from this process are unintentional.  Marciano Mundt M.D on 08/22/2016 at 10:03 AM  Between 7am to 6pm - Pager - [phone]  After 6pm go to [email or web address] - Proofreader  [facility]  Office  [phone]  CC: Primary care physician; [place], [facility]

## 2016-08-22 NOTE — Progress Notes (Signed)
IV left in place in left should has patient is such a difficult stick.

## 2016-08-22 NOTE — Progress Notes (Signed)
[facility]  ROUNDING NOTE   Subjective:   Son at bedside.  More alert this morning Urine culture with gram negative rods - started on ceftriaxone.   UOP 1550  Creatinine 2.25 (2.84)  NS at 143mL/hr  Objective:  Vital signs in last 24 hours:  Temp:  [97.7 F (36.5 C)-98 F (36.7 C)] 97.7 F (36.5 C) (05/22 0828) Pulse Rate:  [96-131] 121 (05/22 0828) Resp:  [18-20] 20 (05/22 0828) BP: (88-108)/(46-60) 97/60 (05/22 0828) SpO2:  [98 %-100 %] 98 % (05/22 0828) Weight:  [82.1 kg (181 lb)] 82.1 kg (181 lb) (05/22 0427)  Weight change:  Filed Weights   08/18/16 1208 08/22/16 0427  Weight: 74.4 kg (164 lb) 82.1 kg (181 lb)    Intake/Output: I/O last 3 completed shifts: In: 2125.3 [P.O.:240; I.V.:1568.7; IV Piggyback:316.7] Out: 2275 [Urine:2275]   Intake/Output this shift:  Total I/O In: -  Out: 450 [Urine:450]  Physical Exam: General: No acute distress  Head: Normocephalic, atraumatic. Moist oral mucosal membranes  Eyes: Anicteric  Neck: Supple, trachea midline  Lungs:  Clear to auscultation, normal effort  Heart: Tachycardia, irregular  Abdomen:  Soft, nontender, bowel sounds present  Extremities: 1+ peripheral edema. 2+ edema right upper extremity  Neurologic: Awake, alert  Skin: No lesions       Basic Metabolic Panel:  Recent Labs Lab 08/18/16 1232 08/19/16 0536 08/20/16 0953 08/21/16 0405 08/22/16 0516  NA 123* 127* 129* 129* 134*  K 4.5 4.3 4.7 3.4* 4.3  CL 82* 88* 95* 96* 105  CO2 30 28 24 27 24   GLUCOSE 104* 60* 119* 149* 84  BUN 101* 102* 90* 82* 71*  CREATININE 4.80* 4.17* 3.25* 2.84* 2.25*  CALCIUM 8.2* 7.8* 7.8* 7.6* 8.4*  MG  --   --   --  1.9  --     Liver Function Tests:  Recent Labs Lab 08/18/16 1232 08/19/16 0536  AST 97* 68*  ALT 434* 304*  ALKPHOS 110 96  BILITOT 1.2 1.2  PROT 7.6 6.8  ALBUMIN 2.8* 2.5*    Recent Labs Lab 08/18/16 1232 08/19/16 0536  LIPASE 146* 124*   No results for input(s):  AMMONIA in the last 168 hours.  CBC:  Recent Labs Lab 08/18/16 1232 08/19/16 0536 08/21/16 0952 08/22/16 0947  WBC 9.5 9.2 8.7 11.5*  NEUTROABS 8.2*  --   --   --   HGB 9.8* 9.0* 8.3* 9.4*  HCT 29.5* 26.7* 25.5* 29.5*  MCV 83.9 83.7 86.8 86.0  PLT 171 173 176 222    Cardiac Enzymes:  Recent Labs Lab 08/18/16 1232  TROPONINI 0.10*    BNP: Invalid input(s): POCBNP  CBG:  Recent Labs Lab 08/19/16 0954 08/19/16 1222 08/21/16 0825 08/21/16 1146 08/22/16 0737  GLUCAP 84 68 131* 219* 34    Microbiology: Results for orders placed or performed during the hospital encounter of 08/18/16  MRSA PCR Screening     Status: None   Collection Time: 08/18/16 10:34 PM  Result Value Ref Range Status   MRSA by PCR NEGATIVE NEGATIVE Final    Comment:        The GeneXpert MRSA Assay (FDA approved for NASAL specimens only), is one component of a comprehensive MRSA colonization surveillance program. It is not intended to diagnose MRSA infection nor to guide or monitor treatment for MRSA infections.   Culture, Urine     Status: Abnormal (Preliminary result)   Collection Time: 08/20/16  5:50 PM  Result Value Ref Range Status  Specimen Description URINE, CATHETERIZED  Final   Special Requests Normal  Final   Culture (A)  Final    >=100,000 COLONIES/mL ESCHERICHIA COLI SUSCEPTIBILITIES TO FOLLOW Performed at [facility], [address], [place], [place] [address]    Report Status PENDING  Incomplete    Coagulation Studies: No results for input(s): LABPROT, INR in the last 72 hours.  Urinalysis: No results for input(s): COLORURINE, LABSPEC, PHURINE, GLUCOSEU, HGBUR, BILIRUBINUR, KETONESUR, PROTEINUR, UROBILINOGEN, NITRITE, LEUKOCYTESUR in the last 72 hours.  Invalid input(s): APPERANCEUR    Imaging: No results found.   Medications:   . sodium chloride 50 mL/hr at 08/22/16 1014  . cefTRIAXone (ROCEPHIN) IVPB 1 gram/50 mL D5W 1 g (08/22/16 1041)  .  sodium chloride Stopped (08/21/16 1407)   . atorvastatin  20 mg Oral Daily  . docusate sodium  100 mg Oral BID  . feeding supplement (NEPRO CARB STEADY)  237 mL Oral BID BM  . levothyroxine  100 mcg Oral QAC breakfast  . metoprolol tartrate  25 mg Oral Q6H  . ondansetron  4 mg Oral Once  . pantoprazole  40 mg Oral Daily  . polyethylene glycol  17 g Oral Daily  . rivaroxaban  15 mg Oral Q supper  . senna  2 tablet Oral QHS   acetaminophen, docusate sodium, ondansetron (ZOFRAN) IV, traMADol  Assessment/ Plan:  83 y.o. black female with Atrial fibrillation, left breast cancer, diabetes mellitus type 2, hypertension, history of femur fracture managed conservatively in March 2018 who was admitted to [facility] on 08/18/2016 for evaluation of acute renal failure.   1. Acute renal failure with baseline GFR >60 from 06/17/2016. Acute renal failure from prolonged prerenal azotemia. Improving with IV NS  - Decrease IV NS to 74mL/hr  2. Hyponatremia: hypovolemic. Na 134 - improving with saline administration.   3. Anemia with renal failure: hemoglobin 8.3  4. Hypertension and atrial fibrillation: irregular.  Hypotensive at times.  - metoprolol  5. Urinary Tract Infection: gram negative rods - empiric ceftriaxone   LOS: 4 Ranee Peasley 5/22/201812:02 PM

## 2016-08-22 NOTE — Progress Notes (Signed)
Released to care of [facility], discharge packet given for transport.  Pt transported via stretcher with daughter at bedside.  Belongings given to daughter.

## 2016-08-22 NOTE — Progress Notes (Signed)
Patient Name: Cindy Tran Date of Encounter: 08/22/2016  Primary Cardiologist: [facility] Problem List     Principal Problem:   Failure to thrive in adult Active Problems:   Acute renal failure (ARF) (HCC)   GI bleed   Pressure injury of skin   Chronic a-fib (HCC)   Dehydration   Hypokalemia   Sick sinus syndrome ([place])   History of pacemaker   Cardiomyopathy due to chemotherapy ([place])   Anemia     Subjective   Somnolent this morning. Will wake when asked. Minimal to no appetite. No complaints. Heart rate improved to the upper 90s to low 100s bpm, in Afib. BP stable. Potassium improved. TSH noted to be 14. Magnesium 1.9.   Inpatient Medications    Scheduled Meds: . atorvastatin  20 mg Oral Daily  . docusate sodium  100 mg Oral BID  . feeding supplement (NEPRO CARB STEADY)  237 mL Oral BID BM  . heparin subcutaneous  5,000 Units Subcutaneous Q8H  . levothyroxine  100 mcg Oral QAC breakfast  . metoprolol tartrate  25 mg Oral Q6H  . ondansetron  4 mg Oral Once  . pantoprazole  40 mg Oral Daily  . polyethylene glycol  17 g Oral Daily  . senna  2 tablet Oral QHS   Continuous Infusions: . sodium chloride 100 mL/hr at 08/21/16 2335  . sodium chloride Stopped (08/21/16 1407)   PRN Meds: acetaminophen, docusate sodium, ondansetron (ZOFRAN) IV   Vital Signs    Vitals:   08/21/16 1436 08/21/16 1820 08/21/16 2027 08/22/16 0427  BP: 103/60 (!) 89/52 (!) 99/46 (!) 108/57  Pulse: (!) 131 98 (!) 113 96  Resp:   18 18  Temp:   98 F (36.7 C) 97.9 F (36.6 C)  TempSrc:   Oral Oral  SpO2:   100% 100%  Weight:    181 lb (82.1 kg)  Height:        Intake/Output Summary (Last 24 hours) at 08/22/16 0818 Last data filed at 08/22/16 0427  Gross per 24 hour  Intake          1098.67 ml  Output             1550 ml  Net          -451.33 ml   Filed Weights   08/18/16 1208 08/22/16 0427  Weight: 164 lb (74.4 kg) 181 lb (82.1 kg)    Physical Exam    GEN:  Somnolent, in no acute distress.  HEENT: Grossly normal.  Neck: Supple, no JVD, carotid bruits, or masses. Cardiac: Irregularly irregular, II/VI systolic murmur, no rubs, or gallops. No clubbing, cyanosis, edema.  Radials/DP/PT 2+ and equal bilaterally.  Respiratory:  Diminished breath sounds bilaterally. GI: Soft, nontender, nondistended, BS + x 4. MS: No deformity or atrophy. Skin: Warm and dry, no rash. Neuro:  Somnolent. Psych: Somnolent.  Labs    CBC  Recent Labs  08/21/16 0952  WBC 8.7  HGB 8.3*  HCT 25.5*  MCV 86.8  PLT 756   Basic Metabolic Panel  Recent Labs  08/21/16 0405 08/22/16 0516  NA 129* 134*  K 3.4* 4.3  CL 96* 105  CO2 27 24  GLUCOSE 149* 84  BUN 82* 71*  CREATININE 2.84* 2.25*  CALCIUM 7.6* 8.4*  MG 1.9  --    Liver Function Tests No results for input(s): AST, ALT, ALKPHOS, BILITOT, PROT, ALBUMIN in the last 72 hours. No results for input(s): LIPASE, AMYLASE  in the last 72 hours. Cardiac Enzymes No results for input(s): CKTOTAL, CKMB, CKMBINDEX, TROPONINI in the last 72 hours. BNP Invalid input(s): POCBNP D-Dimer No results for input(s): DDIMER in the last 72 hours. Hemoglobin A1C No results for input(s): HGBA1C in the last 72 hours. Fasting Lipid Panel No results for input(s): CHOL, HDL, LDLCALC, TRIG, CHOLHDL, LDLDIRECT in the last 72 hours. Thyroid Function Tests  Recent Labs  08/21/16 0405  TSH 14.035*    Telemetry    Afib, upper 90s to low 100s bpm - Personally Reviewed  ECG    n/a - Personally Reviewed  Radiology    No results found.  Cardiac Studies   N/a (see consult)  Patient Profile     83 y.o. female with history of chronic Afib on Xarelto at home, SSS s/p MDt PPM in 12/2011, cardiomyopathy due to chemotherapy, breast cancer s/p left-sided mastectomy and right-sided lumpectomy s/p chemo and radiation, DM2, HTN, prior TIA, obesity and recent femoral fracture in 06/2016 medically managed who presented to [facility] on  5/18 with failure to thrive with increased weakness and fatigue in the setting of poor PO intake and dehydration. She was found to have ARF with a SCr of 4.80 (baseline 0.49) along with worsening anemia, and possible UTI. She went into Afib with RVR on the morning of 5/21 and cardiology was asked to evaluate.  Assessment & Plan    1. Chronic Afib with RVR: -Heart rates currently improved to the upper 90s to low 100s bpm  -Possibly in the setting of her hypokalemia, UTI, failure to thrive/AKI/dehydration/GI bleed, and elevated TSH -Recommend treating underlying driving forces initially as this will lead to improvement in heart rate -Change metoprolol to 25 mg q 6 hours for rate control -Has been in chronic Afib for some time now without plans to restore sinus rhythm -Renal function precludes addition on digoxin -On Xarelto coming in, has been held this admission given renal function -If renal function remains elevated may need heparin to Coumadin bridge pending hgb/hct -CHADS2VASc at least 8 (CHF, HTN, age x 2, DM, TIA x 2, female)  2. Cardiomyopathy due to chemotherapy: -Most recent echo from 06/2016 with preserved LSVF with mildly elevated PASP, unchanged from prior -She does not appear to be grossly volume overloaded -Cautious with IV fluids  3. SSS: -Status post MDT/PPM in 12/2011 -Followed by [facility]  4. Failure to thrive/ARF/dehydration/malnutrition: -Patient continues to not eat or drink well, suspect this is the major etiology of her admission -Has not been eating or drinking well since transferring nursing facilities  -Renal function improving with IV fluids -Renal on board -Albumin of 2.5 upon admission, would benefit from dietary consult, likely playing a role in some 3rd spacing -Recommend trending albumin, defer to IM -Recommend Palliative Care consult to discuss goals of care given the above  5. GI bleed/acute on IDA: -Hgb has been down trending, possibly dilutional    -Status post EGD with excision of gastric polyp, no active bleed -Per IM -Xarelto on hold this admission as above  6. Hypokalemia: -Repleted  -Magnesium ok  7. Elevated transaminases: -Abdominal US with gallstones and sludge -Per IM  8. Recent femur fracture: -Medically managed  9. Right upper extremity swelling: -Chronic issue for her -Prior upper extremity ultrasound negative for DVT -Has been on Xarelto as above -less likely DVT -Possibly from 3rd spacing as above  10. Elevated TSH: -Per IM  Signed, Christell Faith, PA-C [facility] Pager: [phone] 08/22/2016, 8:18 AM

## 2016-08-22 NOTE — Progress Notes (Addendum)
Patient is being transferred to [facility].  Telemetry has been dc'd.  IV will remain in as she is such a tough stick.  Discharge summary was reviewed and given to her daughter.  A copy is in the discharge packet.  [facility] has been called, they said it may be several hours.

## 2016-08-22 NOTE — Care Management Important Message (Signed)
Important Message  Patient Details  Name: Cindy Tran MRN: [id number] Date of Birth: Jan 23, 1931   Medicare Important Message Given:  No transfer to another facility   Katrina Stack, RN 08/22/2016, 4:18 PM

## 2016-08-22 NOTE — Progress Notes (Signed)
SLP Cancellation Note  Patient Details Name: Cindy Tran MRN: [id number] DOB: 02/27/1931   Cancelled treatment:       Reason Eval/Treat Not Completed:  (chart reviewed; consulted NSG; pt being transferred). NSG stated pt is being transferred to another hospital facility and will receive f/u assessment there. Report called. ST services will f/u w/ assessment here if pt does not transfer to outside facility. NSG agreed.    Cindy Kenner, MS, CCC-SLP Cindy Tran 08/22/2016, 4:25 PM

## 2016-08-22 NOTE — Care Management (Signed)
Informed that patient is for transfer to [facility] per request of patient's daughter.  Informed patient's daughter that unless it is medically necessary to purse a transfer to facility - to seek treatment that is not available at the transferring facility, medicare will not cover the transfer.   The reason for this transfer is preference- patient's physicians are at the [facility] facility.

## 2016-08-22 NOTE — Clinical Social Work Note (Signed)
Patient is transferring to [facility], CSW updated [facility] [place].  CSW to sign off.  Evette Cristal, MSW, [place]

## 2016-08-22 NOTE — Discharge Summary (Addendum)
[place] [facility] [place] NAME: Cindy Tran    MR#:  [id number]  DATE OF BIRTH:  12/13/1930  DATE OF ADMISSION:  08/18/2016 ADMITTING PHYSICIAN: Vaughan Basta, MD  DATE OF DISCHARGE: 08/22/16  PRIMARY CARE PHYSICIAN: Harlow Ohms, MD    ADMISSION DIAGNOSIS:  Upper GI bleeding [K92.2] PICC (peripherally inserted central catheter) in place [Z45.2] Acute renal failure (ARF) (HCC) [N17.9] Atrial fibrillation with RVR (HCC) [I48.91] Acute renal failure, unspecified acute renal failure type ([place]) [N17.9]  DISCHARGE DIAGNOSIS:  Acute renal failure due to Dehydration/poor po intake for several days Afib with RVR GNR UTI Right LE femur fracture Chronic cholelithiasis SECONDARY DIAGNOSIS:   Past Medical History:  Diagnosis Date  . Atrial fibrillation ([employer])   . Breast cancer (HCC)    Left side  . Cancer ([place])   . Diabetes mellitus without complication ([place])   . Hypertension     HOSPITAL COURSE:   LouiseHerbinis a 83 y.o.femalewith a known history of A fib, femur fracture in march 2018- managed conservatively, breast cancer, DM, Htn- noted to have Ac renal filure at [facility], they tried oral hydration, but it did not help much so called PICC line team, while they tied-she had oozing blood around the PICC site and also had nausea and spit up some blood , so suggested to take her to ER.  * Ac renal failure appears pre-renal azotemia due to poor po intake -Baseline creatinine 06/17/2016 is 0.79 [facility] labs) -IV fluids for now--decrease rate to 75 ml/hr -avoid nephrotoxins -Renal US -nephro consult appreciated -came in with creat of 4.80---4.17---3.2--2.8--2.2  * UTI GNR on UC Start IV rocephin  * Patient had an episode where she spit up some blood not sure if this is true GI bleed  -hgb stable GI consult noted---no luminal eval needed Protonix IV for now. -Patient had upper GI endoscopy  On 07/03/16 at [facility] and  showed gastric polyp that was removed. Normal esophagus normal duodenum -resume xarelto once creat stable -Colonoscopy done in 2016 showed few polyps that were removed.  * A fib -Cont metoprolol  -will resume Xarelto  today ( 08/22/16)  now that creat is improving -most recent echo at [facility] 06/2016  Showed Left ventricular hypertrophy - mild Normal left ventricular systolic function, ejection fraction > 55% Dilated left atrium - mild Mitral annular calcification  Aortic sclerosis, Aortic regurgitation - mild Normal right ventricular systolic function Tricuspid regurgitation - mild Elevated pulmonary artery systolic pressure - mild -Cardiology consultation for rapid A. fib with RVR with Dr Fletcher Anon appreciated. Pt is now on metoprolol 25 mg q 6 hourly  * elevated LFTs and Lipase US abdomen shows gallstones with sludge. Pt asymptomatic  * femur fracture in march 2018 (was admitted at [facility]) -Conservative management per ortho for last 8 weeks, cont cast/right knee immobiliser and folow in office in 2-3 weeks. -Patient follows with [facility]  *"chronic pressure ulcers  Stage II - Partial thickness loss of dermis presenting as a shallow open ulcer with a red, pink wound bed without slough. Ankle rt lateral"   "Stage I - Intact skin with non-blanchable redness of a localized area usually over a bony prominence-Posterior Buttocks"   Discussed with patient's daughter Cindy Tran and she was requesting if I can transfer patient to [facility]. Spoke with [facility] transfer center and pt has a bed at [address] [facility] accepting  Physician.  D/c later today CONSULTS OBTAINED:  Treatment Team:  Lin Landsman, MD Wellington Hampshire, MD  DRUG ALLERGIES:  No Known Allergies  DISCHARGE MEDICATIONS:   Current Discharge Medication List    START taking these medications   Details  cefTRIAXone 1 g in dextrose 5 % 50 mL Inject 1 g into the vein daily. Qty: 1 application, Refills: 0     metoprolol tartrate (LOPRESSOR) 25 MG tablet Take 1 tablet (25 mg total) by mouth every 6 (six) hours. Qty: 30 tablet, Refills: 0      CONTINUE these medications which have NOT CHANGED   Details  atorvastatin (LIPITOR) 20 MG tablet Take 20 mg by mouth daily.    Cholecalciferol (D3-1000 PO) Take 1,000 Units by mouth daily.    docusate sodium (COLACE) 100 MG capsule Take 100 mg by mouth 2 (two) times daily.    esomeprazole (NEXIUM) 40 MG capsule Take 40 mg by mouth daily.    ferrous sulfate 325 (65 FE) MG tablet Take 325 mg by mouth 2 (two) times daily. Refills: 3    levothyroxine (SYNTHROID, LEVOTHROID) 100 MCG tablet Take 100 mcg by mouth daily before breakfast.    ondansetron (ZOFRAN) 4 MG tablet Take 4 mg by mouth every 8 (eight) hours as needed for nausea or vomiting.    polyethylene glycol (MIRALAX / GLYCOLAX) packet Take 17 g by mouth daily.    promethazine (PHENERGAN) 6.25 MG/5ML syrup Take 12.5 mg by mouth every 6 (six) hours as needed for nausea or vomiting.    senna (SENOKOT) 8.6 MG tablet Take 2 tablets by mouth at bedtime.    vitamin B-12 (CYANOCOBALAMIN) 1000 MCG tablet Take 1,000 mcg by mouth daily.    XARELTO 20 MG TABS tablet Take 20 mg by mouth daily.      STOP taking these medications     diltiazem (CARDIZEM CD) 180 MG 24 hr capsule      enalapril (VASOTEC) 20 MG tablet      furosemide (LASIX) 40 MG tablet      metoprolol succinate (TOPROL-XL) 50 MG 24 hr tablet         If you experience worsening of your admission symptoms, develop shortness of breath, life threatening emergency, suicidal or homicidal thoughts you must seek medical attention immediately by calling 911 or calling your MD immediately  if symptoms less severe.  You Must read complete instructions/literature along with all the possible adverse reactions/side effects for all the Medicines you take and that have been prescribed to you. Take any new Medicines after you have completely  understood and accept all the possible adverse reactions/side effects.   Please note  You were cared for by a hospitalist during your hospital stay. If you have any questions about your discharge medications or the care you received while you were in the hospital after you are discharged, you can call the unit and asked to speak with the hospitalist on call if the hospitalist that took care of you is not available. Once you are discharged, your primary care physician will handle any further medical issues. Please note that NO REFILLS for any discharge medications will be authorized once you are discharged, as it is imperative that you return to your primary care physician (or establish a relationship with a primary care physician if you do not have one) for your aftercare needs so that they can reassess your need for medications and monitor your lab values. Today   SUBJECTIVE     VITAL SIGNS:  Blood pressure 97/60, pulse (!) 121, temperature  97.7 F (36.5 C), temperature source Axillary, resp. rate 20, height 5\' 5"  (1.651 m), weight 82.1 kg (181 lb), SpO2 98 %.  I/O:    Intake/Output Summary (Last 24 hours) at 08/22/16 1428 Last data filed at 08/22/16 1411  Gross per 24 hour  Intake              782 ml  Output             1600 ml  Net             -818 ml    PHYSICAL EXAMINATION:  GENERAL:  83 y.o.-year-old patient lying in the bed with no acute distress.  EYES: Pupils equal, round, reactive to light and accommodation. No scleral icterus. Extraocular muscles intact.  HEENT: Head atraumatic, normocephalic. Oropharynx and nasopharynx clear.  NECK:  Supple, no jugular venous distention. No thyroid enlargement, no tenderness.  LUNGS: Normal breath sounds bilaterally, no wheezing, rales,rhonchi or crepitation. No use of accessory muscles of respiration.  CARDIOVASCULAR: S1, S2 normal. No murmurs, rubs, or gallops.  ABDOMEN: Soft, non-tender, non-distended. Bowel sounds present. No  organomegaly or mass.  EXTREMITIES: No pedal edema, cyanosis, or clubbing.  NEUROLOGIC: Cranial nerves II through XII are intact. Muscle strength 5/5 in all extremities. Sensation intact. Gait not checked.  PSYCHIATRIC: The patient is alert and oriented x 3.  SKIN: No obvious rash, lesion, or ulcer.   DATA REVIEW:   CBC   Recent Labs Lab 08/22/16 0947  WBC 11.5*  HGB 9.4*  HCT 29.5*  PLT 222    Chemistries   Recent Labs Lab 08/19/16 0536  08/21/16 0405 08/22/16 0516  NA 127*  < > 129* 134*  K 4.3  < > 3.4* 4.3  CL 88*  < > 96* 105  CO2 28  < > 27 24  GLUCOSE 60*  < > 149* 84  BUN 102*  < > 82* 71*  CREATININE 4.17*  < > 2.84* 2.25*  CALCIUM 7.8*  < > 7.6* 8.4*  MG  --   --  1.9  --   AST 68*  --   --   --   ALT 304*  --   --   --   ALKPHOS 96  --   --   --   BILITOT 1.2  --   --   --   < > = values in this interval not displayed.  Microbiology Results   Recent Results (from the past 240 hour(s))  MRSA PCR Screening     Status: None   Collection Time: 08/18/16 10:34 PM  Result Value Ref Range Status   MRSA by PCR NEGATIVE NEGATIVE Final    Comment:        The GeneXpert MRSA Assay (FDA approved for NASAL specimens only), is one component of a comprehensive MRSA colonization surveillance program. It is not intended to diagnose MRSA infection nor to guide or monitor treatment for MRSA infections.   Culture, Urine     Status: Abnormal (Preliminary result)   Collection Time: 08/20/16  5:50 PM  Result Value Ref Range Status   Specimen Description URINE, CATHETERIZED  Final   Special Requests Normal  Final   Culture (A)  Final    >=100,000 COLONIES/mL ESCHERICHIA COLI SUSCEPTIBILITIES TO FOLLOW Performed at [facility], [address], [place], [place] [address]    Report Status PENDING  Incomplete    RADIOLOGY:  US Venous Img Upper Uni Right  Result Date: 08/22/2016  CLINICAL DATA:  History of breast cancer, now with right upper extremity  edema. Evaluate for DVT. EXAM: RIGHT UPPER EXTREMITY VENOUS DOPPLER ULTRASOUND TECHNIQUE: Gray-scale sonography with graded compression, as well as color Doppler and duplex ultrasound were performed to evaluate the upper extremity deep venous system from the level of the subclavian vein and including the jugular, axillary, basilic, radial, ulnar and upper cephalic vein. Spectral Doppler was utilized to evaluate flow at rest and with distal augmentation maneuvers. COMPARISON:  None. FINDINGS: Contralateral Subclavian Vein: Respiratory phasicity is normal and symmetric with the symptomatic side. No evidence of thrombus. Normal compressibility. Internal Jugular Vein: No evidence of thrombus. Normal compressibility, respiratory phasicity and response to augmentation. Subclavian Vein: No evidence of thrombus. Normal compressibility, respiratory phasicity and response to augmentation. Axillary Vein: No evidence of thrombus. Normal compressibility, respiratory phasicity and response to augmentation. Cephalic Vein: No evidence of thrombus. Normal compressibility, respiratory phasicity and response to augmentation. Basilic Vein: No evidence of thrombus. Normal compressibility, respiratory phasicity and response to augmentation. Brachial Veins: No evidence of thrombus. Normal compressibility, respiratory phasicity and response to augmentation. Radial Veins: No evidence of thrombus. Normal compressibility, respiratory phasicity and response to augmentation. Ulnar Veins: No evidence of thrombus. Normal compressibility, respiratory phasicity and response to augmentation. Venous Reflux:  None visualized. Other Findings: There is a moderate amount of subcutaneous edema seen throughout the right upper extremity. IMPRESSION: 1. No evidence of DVT within the right upper extremity. 2. Moderate amount of subcutaneous edema throughout the right upper extremity. Electronically Signed   By: Sandi Mariscal M.D.   On: 08/22/2016 12:51      Management plans discussed with the patient, family and they are in agreement.  CODE STATUS:     Code Status Orders        Start     Ordered   08/18/16 1660  Do not attempt resuscitation (DNR)  Continuous    Question Answer Comment  In the event of cardiac or respiratory ARREST Do not call a "code blue"   In the event of cardiac or respiratory ARREST Do not perform Intubation, CPR, defibrillation or ACLS   In the event of cardiac or respiratory ARREST Use medication by any route, position, wound care, and other measures to relive pain and suffering. May use oxygen, suction and manual treatment of airway obstruction as needed for comfort.      08/18/16 1715    Code Status History    Date Active Date Inactive Code Status Order ID Comments User Context   This patient has a current code status but no historical code status.    Advance Directive Documentation     Most Recent Value  Type of Advance Directive  Out of facility DNR (pink MOST or yellow form)  Pre-existing out of facility DNR order (yellow form or pink MOST form)  Pink MOST form placed in chart (order not valid for inpatient use)  "MOST" Form in Place?  -      TOTAL TIME TAKING CARE OF THIS PATIENT: *40* minutes.    Marline Morace M.D on 08/22/2016 at 2:28 PM  Between 7am to 6pm - Pager - [phone] After 6pm go to [email or web address] - password [email or web address] [facility]  Office  [phone]  CC: Primary care physician; Harlow Ohms, MD

## 2016-08-22 NOTE — Plan of Care (Signed)
Problem: Health Behavior/Discharge Planning: Goal: Ability to manage health-related needs will improve Outcome: Adequate for Discharge Patient is being transferred to [place].  Discharge information and hospital summary given to family.

## 2016-08-24 LAB — URINE CULTURE: Special Requests: NORMAL

## 2016-08-29 LAB — PROTEIN ELECTRO, RANDOM URINE: Total Protein, Urine: 41.6 mg/dL

## 2016-10-06 LAB — ENA+DNA/DS+ANTICH+CENTRO+JO...
Chromatin Ab SerPl-aCnc: 0.2 AI (ref 0.0–0.9)
ENA SM Ab Ser-aCnc: <0.2 AI (ref 0.0–0.9)
SSA (RO) (ENA) ANTIBODY, IGG: 1.1 AI — AB (ref 0.0–0.9)
Scleroderma (Scl-70) (ENA) Antibody, IgG: 0.2 AI (ref 0.0–0.9)
ds DNA Ab: 3 IU/mL (ref 0–9)

## 2016-10-06 LAB — ANA W/REFLEX IF POSITIVE: Anti Nuclear Antibody(ANA): POSITIVE — AB

## 2016-10-06 LAB — GLOMERULAR BASEMENT MEMBRANE ANTIBODIES: GBM Ab: 6 units (ref 0–20)

## 2016-11-01 DEATH — deceased

## 2018-10-07 IMAGING — US US ABDOMEN COMPLETE
1 series · 14 of 25 positions shown · non-contrast
Comparison: None.

CLINICAL DATA: Elevated liver function tests, acute renal failure.

EXAM:
ABDOMEN ULTRASOUND COMPLETE

[Series 1: us abdomen complete · 0.23mm/px · 14 of 73 slices shown]
[im 1/73]
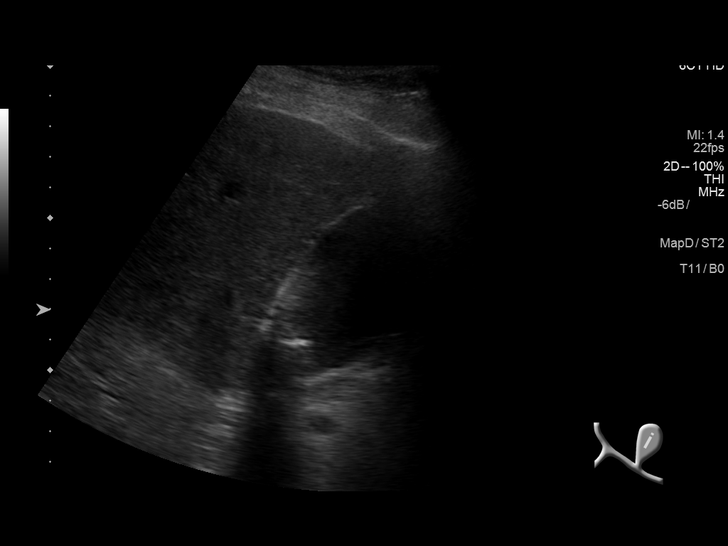
[im 7/73]
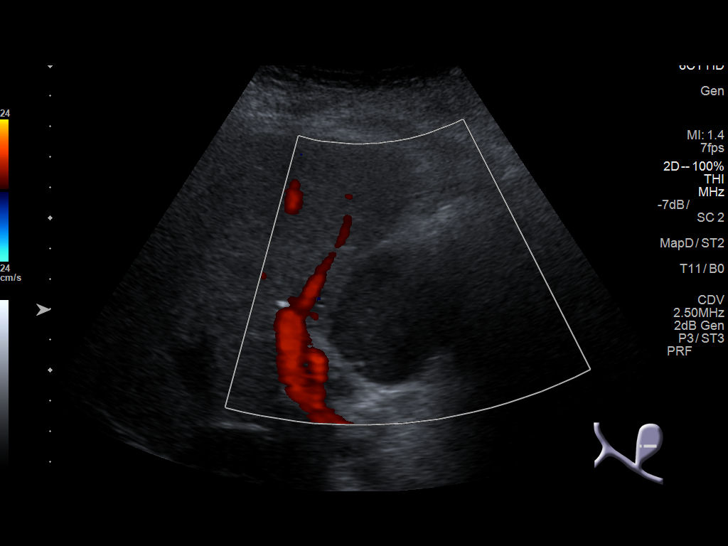
[im 13/73]
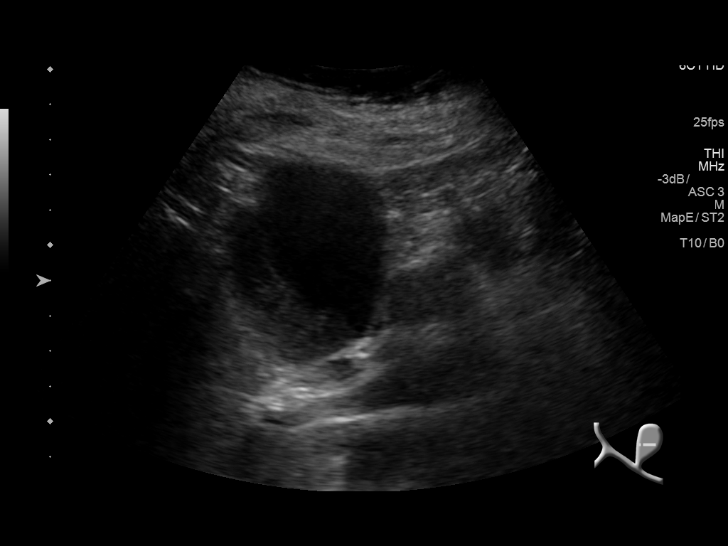
[im 19/73]
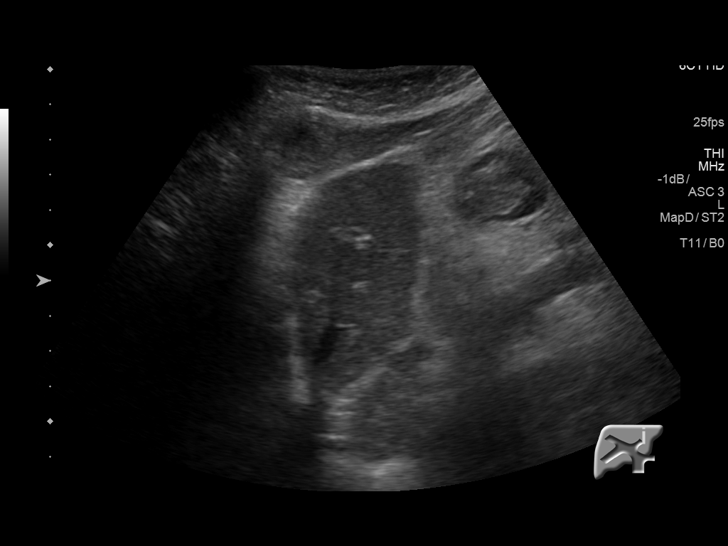
[im 25/73]
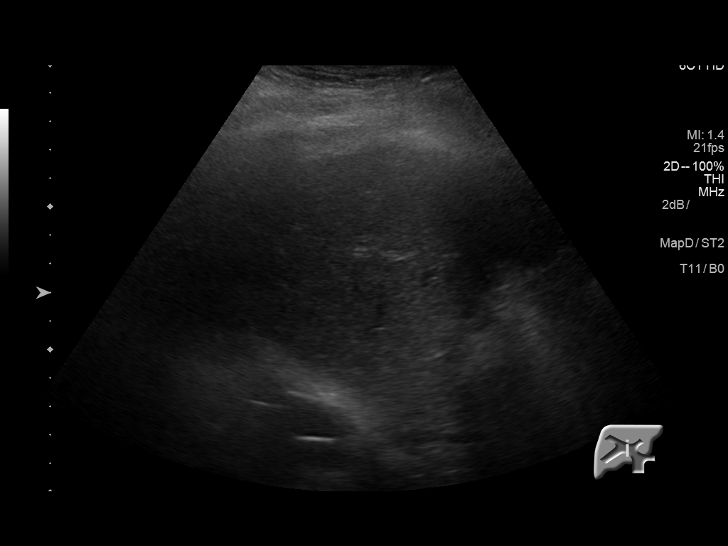
[im 28/73]
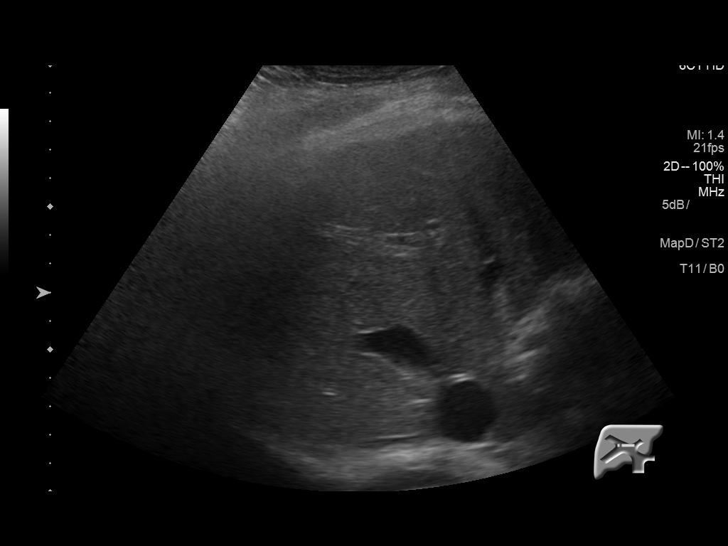
[im 34/73]
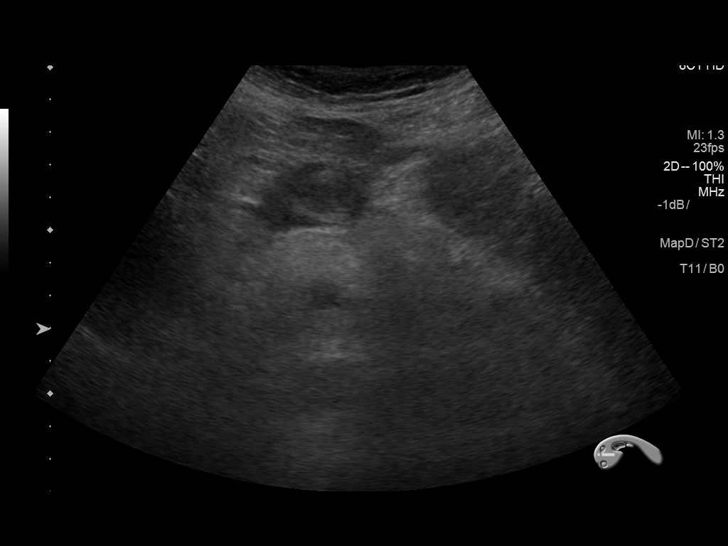
[im 40/73]
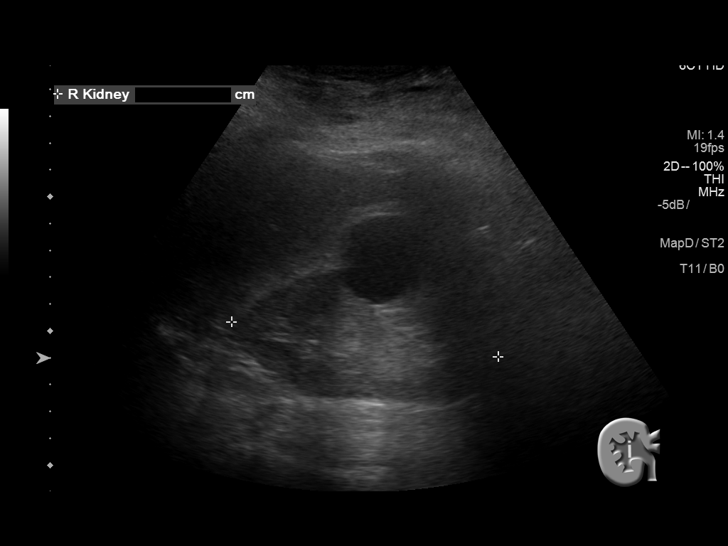
[im 46/73]
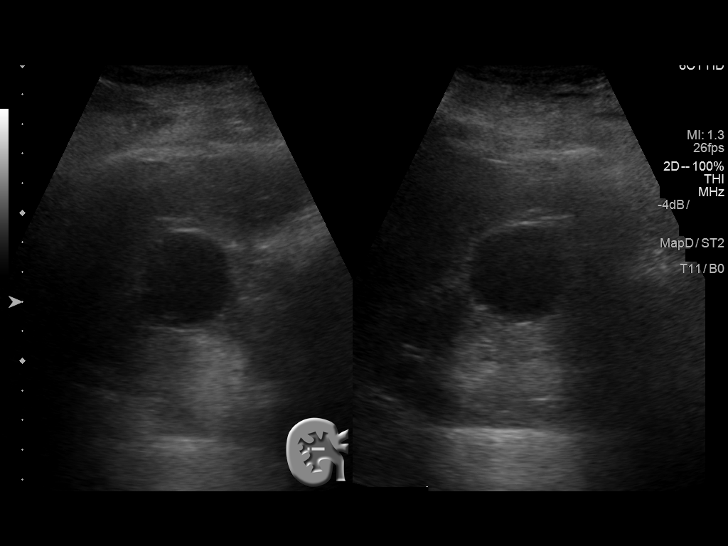
[im 49/73]
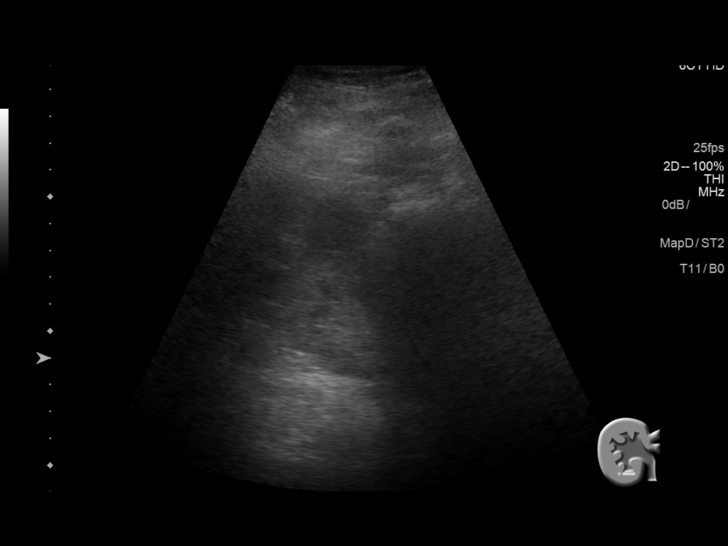
[im 55/73]
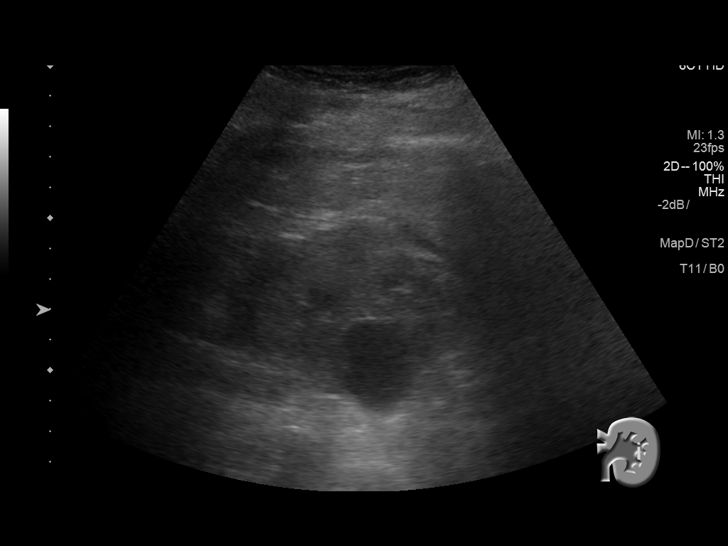
[im 61/73]
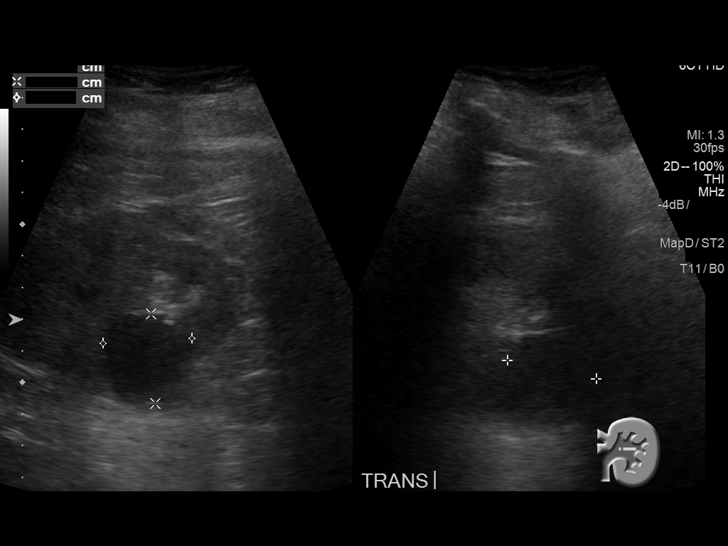
[im 67/73]
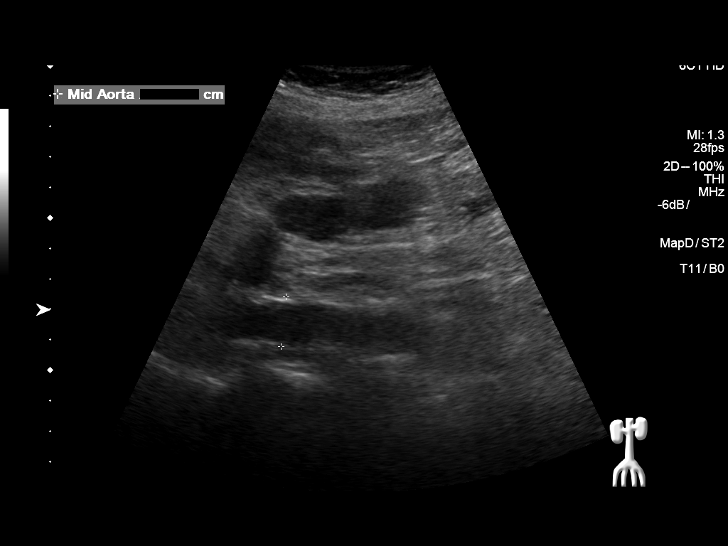
[im 73/73]
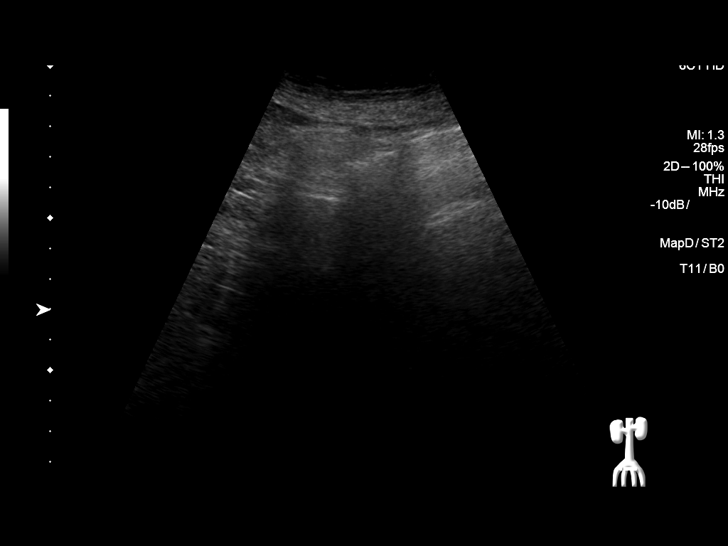

[14 of 25 positions shown; findings below may reference images not displayed]

FINDINGS: Gallbladder: Stones and sludge are seen in the gallbladder. Largest
stone measures approximately 6 mm. No wall thickening.

Common bile duct: Diameter: 3 mm, within normal limits.

Liver: Diffusely increased in echogenicity.

IVC: No abnormality visualized.

Pancreas: Somewhat obscured due to bowel gas.

Spleen: Size and appearance within normal limits.

Right Kidney: Length: 10.0 cm. Parenchymal echogenicity is within
normal limits. Anechoic lesion with increased through transmission
measures 3.4 x 3.4 x 3.2 cm. No hydronephrosis.

Left Kidney: Length: 9.7 cm. An anechoic lesion with increased
through transmission in the interpolar region measures 2.9 x 2.8 x
2.8 cm. No hydronephrosis.

Abdominal aorta: Suboptimally visualized due to bowel. Maximum
measured diameter, 2.5 cm.

Other findings: None.
IMPRESSION: 1. Examination is limited by patient condition.
2. Hepatic steatosis.
3. Gallstones and sludge.
4. Bilateral renal cysts.

## 2018-10-10 IMAGING — US US EXTREM  UP VENOUS*R*
1 series · 13 of 24 positions shown · non-contrast
Comparison: None.

CLINICAL DATA: History of breast cancer, now with right upper
extremity edema. Evaluate for DVT.



[Series 1: us extrem up venous*right* · 0.07mm/px · 13 of 33 slices shown]
[im 1/33]
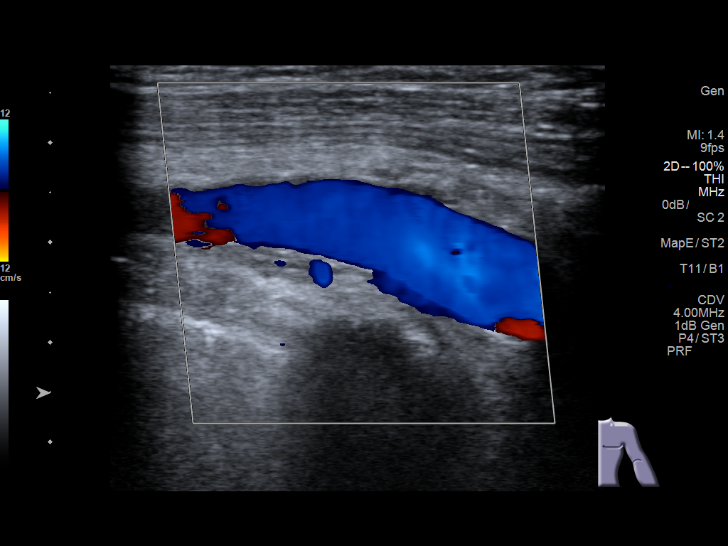
[im 3/33]
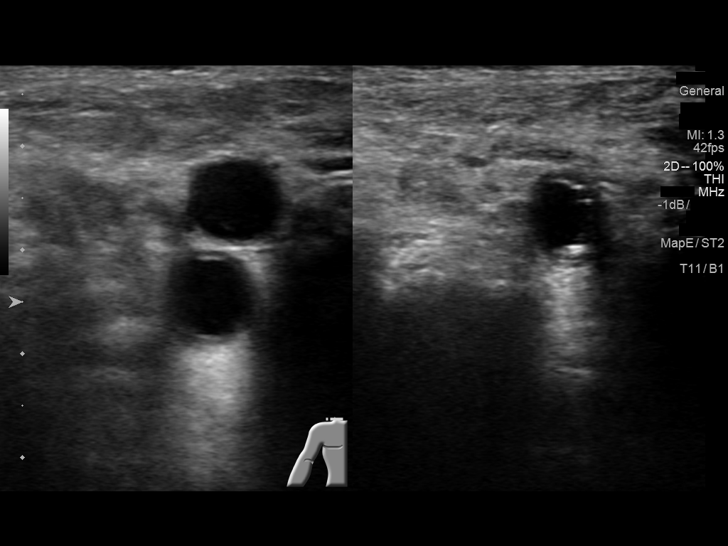
[im 6/33]
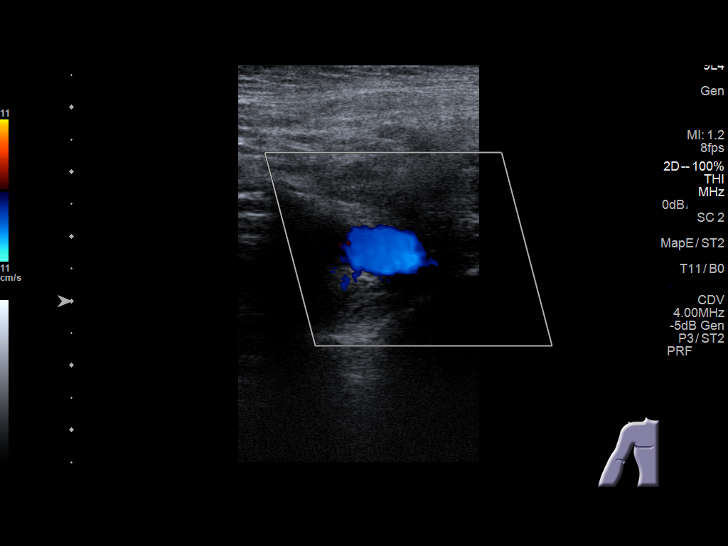
[im 9/33]
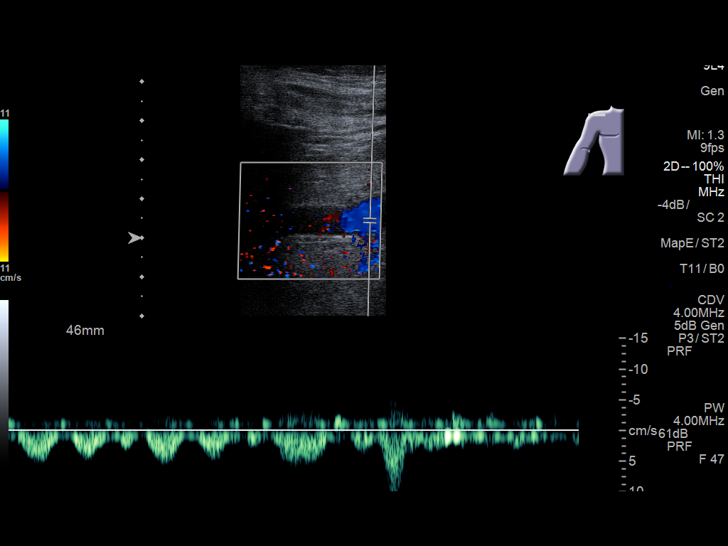
[im 12/33]
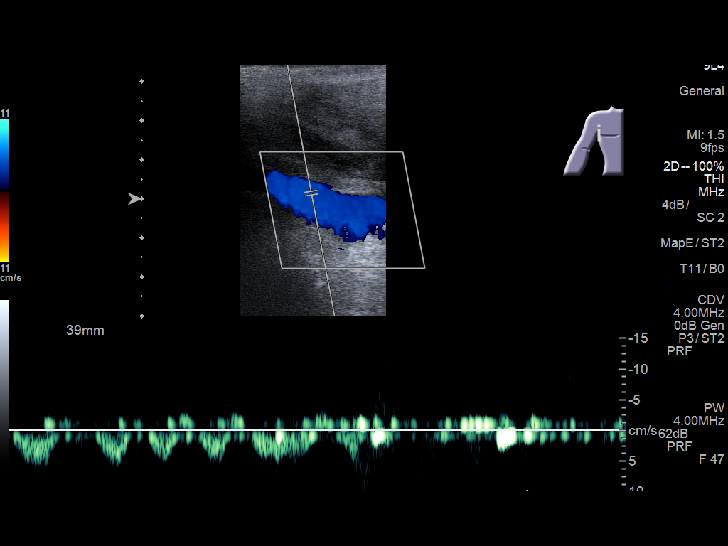
[im 14/33]
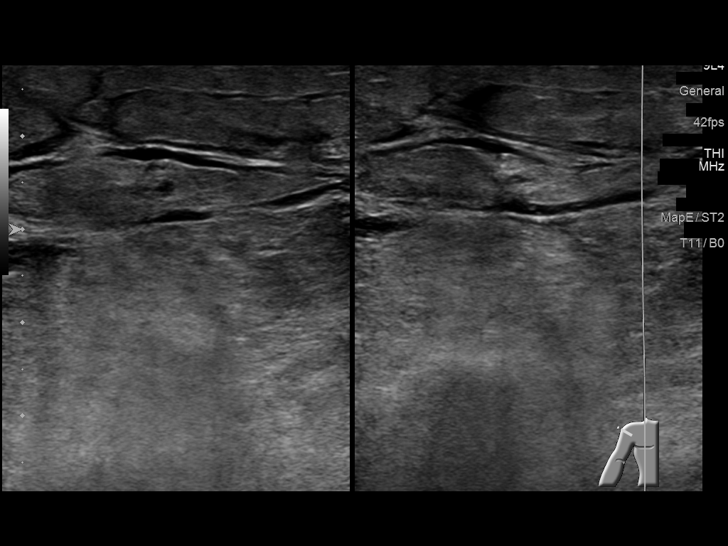
[im 17/33]
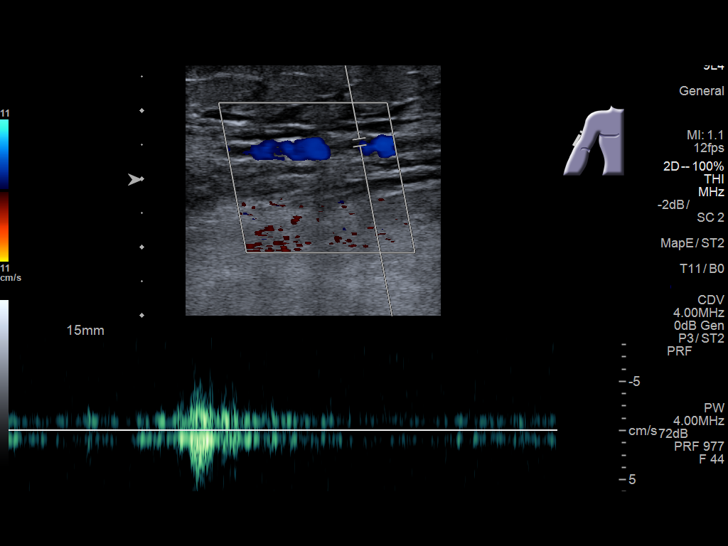
[im 19/33]
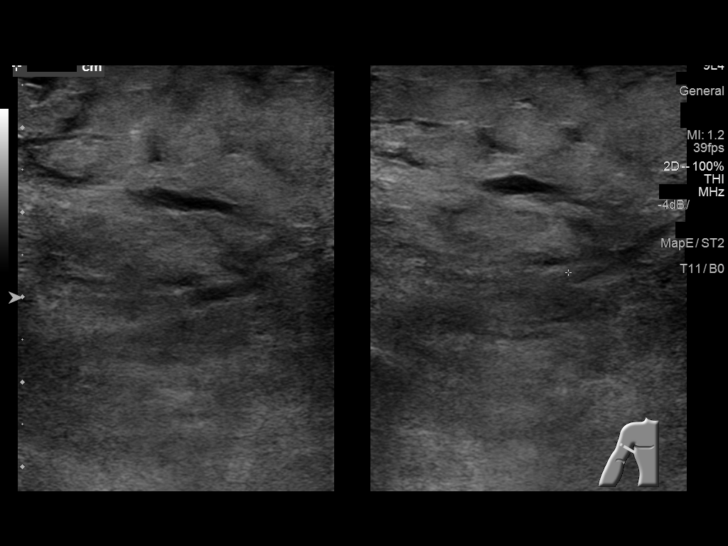
[im 21/33]
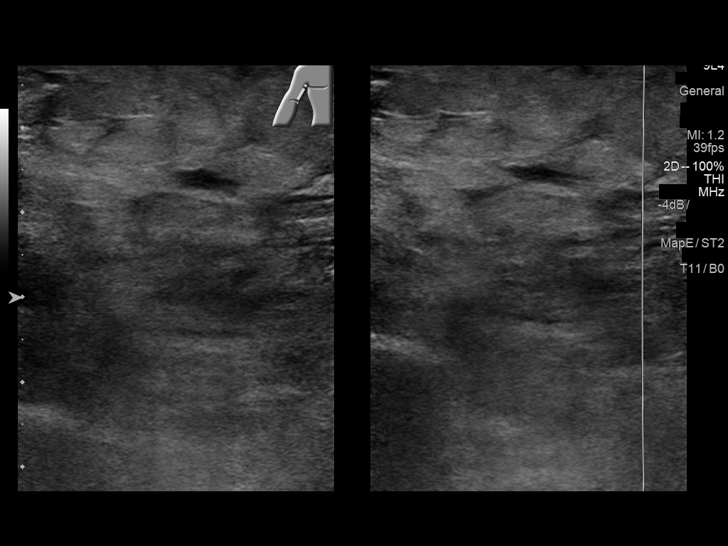
[im 24/33]
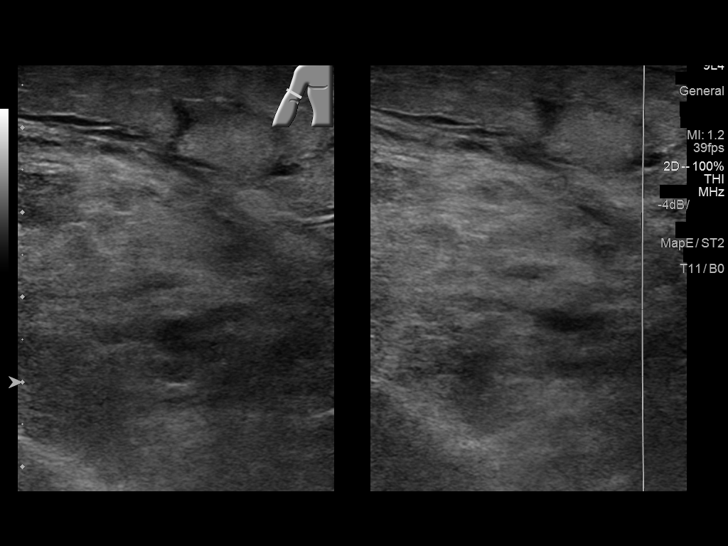
[im 27/33]
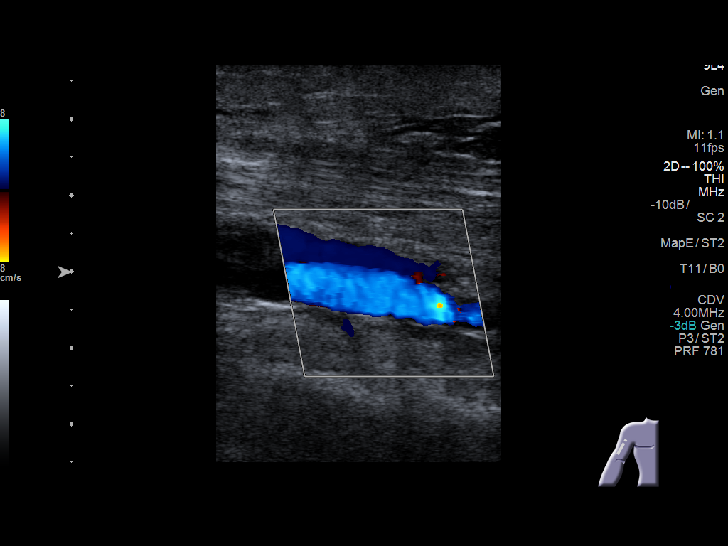
[im 30/33]
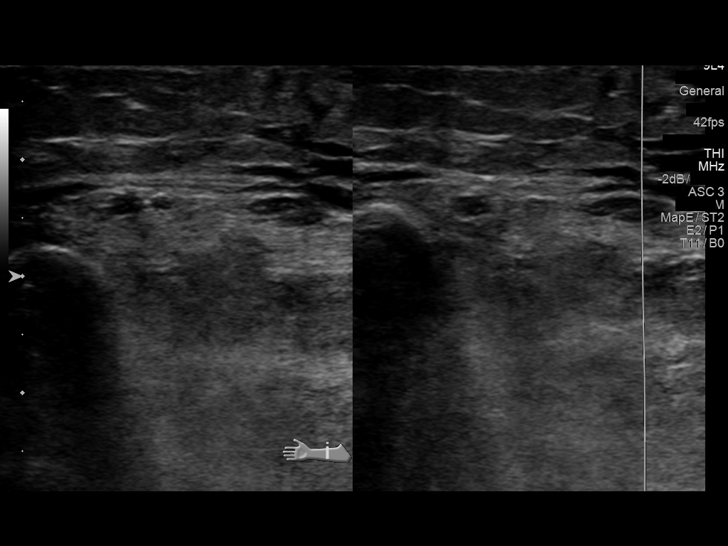
[im 33/33]
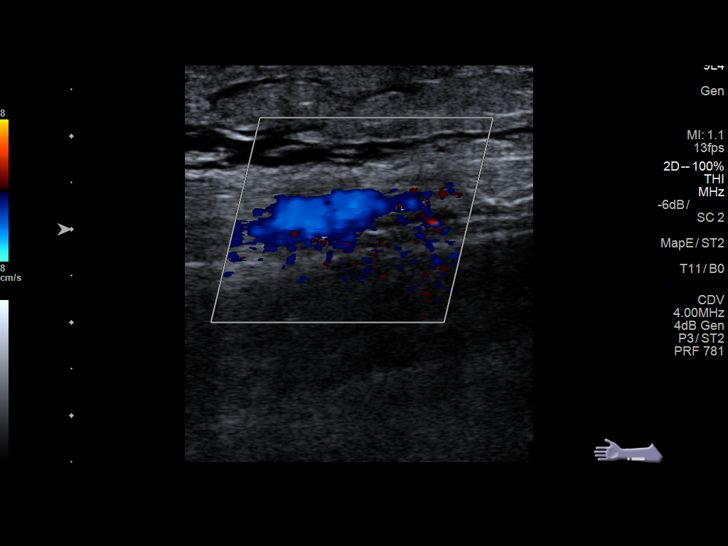

[13 of 24 positions shown; findings below may reference images not displayed]

FINDINGS: Contralateral Subclavian Vein: Respiratory phasicity is normal and
symmetric with the symptomatic side. No evidence of thrombus. Normal
compressibility.

Internal Jugular Vein: No evidence of thrombus. Normal
compressibility, respiratory phasicity and response to augmentation.

Subclavian Vein: No evidence of thrombus. Normal compressibility,
respiratory phasicity and response to augmentation.

Axillary Vein: No evidence of thrombus. Normal compressibility,
respiratory phasicity and response to augmentation.

Cephalic Vein: No evidence of thrombus. Normal compressibility,
respiratory phasicity and response to augmentation.

Basilic Vein: No evidence of thrombus. Normal compressibility,
respiratory phasicity and response to augmentation.

Brachial Veins: No evidence of thrombus. Normal compressibility,
respiratory phasicity and response to augmentation.

Radial Veins: No evidence of thrombus. Normal compressibility,
respiratory phasicity and response to augmentation.

Ulnar Veins: No evidence of thrombus. Normal compressibility,
respiratory phasicity and response to augmentation.

Venous Reflux:  None visualized.

Other Findings: There is a moderate amount of subcutaneous edema
seen throughout the right upper extremity.
IMPRESSION: 1. No evidence of DVT within the right upper extremity.
2. Moderate amount of subcutaneous edema throughout the right upper
extremity.
